# Patient Record
Sex: Female | Born: 1980 | Race: Black or African American | Hispanic: No | Marital: Single | Smoking: Former smoker
Health system: Southern US, Community
[De-identification: ages and names within clinical notes are randomized; demographics above are authoritative.]

## PROBLEM LIST (undated history)

## (undated) DIAGNOSIS — O1205 Gestational edema, complicating the puerperium: Secondary | ICD-10-CM

## (undated) DIAGNOSIS — J208 Acute bronchitis due to other specified organisms: Secondary | ICD-10-CM

## (undated) DIAGNOSIS — F419 Anxiety disorder, unspecified: Secondary | ICD-10-CM

## (undated) DIAGNOSIS — N920 Excessive and frequent menstruation with regular cycle: Secondary | ICD-10-CM

## (undated) DIAGNOSIS — R079 Chest pain, unspecified: Secondary | ICD-10-CM

## (undated) DIAGNOSIS — IMO0002 Reserved for concepts with insufficient information to code with codable children: Secondary | ICD-10-CM

## (undated) DIAGNOSIS — B351 Tinea unguium: Secondary | ICD-10-CM

## (undated) DIAGNOSIS — D259 Leiomyoma of uterus, unspecified: Secondary | ICD-10-CM

## (undated) DIAGNOSIS — E559 Vitamin D deficiency, unspecified: Secondary | ICD-10-CM

## (undated) DIAGNOSIS — B9689 Other specified bacterial agents as the cause of diseases classified elsewhere: Secondary | ICD-10-CM

## (undated) DIAGNOSIS — K219 Gastro-esophageal reflux disease without esophagitis: Secondary | ICD-10-CM

## (undated) DIAGNOSIS — E669 Obesity, unspecified: Secondary | ICD-10-CM

## (undated) DIAGNOSIS — M940 Chondrocostal junction syndrome [Tietze]: Secondary | ICD-10-CM

## (undated) HISTORY — DX: Reserved for concepts with insufficient information to code with codable children: IMO0002

## (undated) HISTORY — DX: Chondrocostal junction syndrome (tietze): M94.0

## (undated) HISTORY — DX: Vitamin D deficiency, unspecified: E55.9

## (undated) HISTORY — DX: Acute bronchitis due to other specified organisms: J20.8

## (undated) HISTORY — DX: Chest pain, unspecified: R07.9

## (undated) HISTORY — DX: Gastro-esophageal reflux disease without esophagitis: K21.9

## (undated) HISTORY — DX: Obesity, unspecified: E66.9

## (undated) HISTORY — DX: Excessive and frequent menstruation with regular cycle: N92.0

## (undated) HISTORY — PX: THYROIDECTOMY, PARTIAL: SHX18

## (undated) HISTORY — DX: Tinea unguium: B35.1

## (undated) HISTORY — DX: Other specified bacterial agents as the cause of diseases classified elsewhere: B96.89

## (undated) HISTORY — DX: Leiomyoma of uterus, unspecified: D25.9

## (undated) HISTORY — PX: WISDOM TOOTH EXTRACTION: SHX21

---

## 2003-02-16 ENCOUNTER — Encounter: Payer: Self-pay | Admitting: Family Medicine

## 2003-02-16 ENCOUNTER — Ambulatory Visit (HOSPITAL_COMMUNITY): Admission: RE | Admit: 2003-02-16 | Discharge: 2003-02-16 | Payer: Self-pay | Admitting: Family Medicine

## 2003-03-11 ENCOUNTER — Ambulatory Visit (HOSPITAL_COMMUNITY): Admission: RE | Admit: 2003-03-11 | Discharge: 2003-03-11 | Payer: Self-pay | Admitting: Family Medicine

## 2003-03-11 ENCOUNTER — Encounter: Payer: Self-pay | Admitting: Family Medicine

## 2003-05-30 ENCOUNTER — Emergency Department (HOSPITAL_COMMUNITY): Admission: EM | Admit: 2003-05-30 | Discharge: 2003-05-31 | Payer: Self-pay | Admitting: Emergency Medicine

## 2003-06-02 ENCOUNTER — Emergency Department (HOSPITAL_COMMUNITY): Admission: EM | Admit: 2003-06-02 | Discharge: 2003-06-02 | Payer: Self-pay | Admitting: Emergency Medicine

## 2004-03-15 ENCOUNTER — Ambulatory Visit (HOSPITAL_COMMUNITY): Admission: RE | Admit: 2004-03-15 | Discharge: 2004-03-15 | Payer: Self-pay | Admitting: Family Medicine

## 2004-04-10 ENCOUNTER — Ambulatory Visit (HOSPITAL_COMMUNITY): Admission: RE | Admit: 2004-04-10 | Discharge: 2004-04-10 | Payer: Self-pay | Admitting: Family Medicine

## 2004-10-02 ENCOUNTER — Ambulatory Visit: Payer: Self-pay | Admitting: Family Medicine

## 2004-10-05 ENCOUNTER — Emergency Department (HOSPITAL_COMMUNITY): Admission: EM | Admit: 2004-10-05 | Discharge: 2004-10-05 | Payer: Self-pay | Admitting: Emergency Medicine

## 2004-10-15 ENCOUNTER — Ambulatory Visit: Payer: Self-pay | Admitting: *Deleted

## 2004-11-14 ENCOUNTER — Ambulatory Visit: Payer: Self-pay | Admitting: Family Medicine

## 2004-11-28 ENCOUNTER — Ambulatory Visit: Payer: Self-pay | Admitting: *Deleted

## 2004-11-28 ENCOUNTER — Ambulatory Visit (HOSPITAL_COMMUNITY): Admission: RE | Admit: 2004-11-28 | Discharge: 2004-11-28 | Payer: Self-pay | Admitting: *Deleted

## 2004-11-29 ENCOUNTER — Ambulatory Visit: Payer: Self-pay | Admitting: *Deleted

## 2004-12-04 ENCOUNTER — Ambulatory Visit: Payer: Self-pay | Admitting: Family Medicine

## 2004-12-13 ENCOUNTER — Ambulatory Visit: Payer: Self-pay | Admitting: *Deleted

## 2005-03-12 ENCOUNTER — Ambulatory Visit: Payer: Self-pay | Admitting: Family Medicine

## 2005-08-15 ENCOUNTER — Ambulatory Visit: Payer: Self-pay | Admitting: Family Medicine

## 2005-08-17 ENCOUNTER — Ambulatory Visit (HOSPITAL_COMMUNITY): Admission: RE | Admit: 2005-08-17 | Discharge: 2005-08-17 | Payer: Self-pay | Admitting: Family Medicine

## 2005-08-20 ENCOUNTER — Ambulatory Visit (HOSPITAL_COMMUNITY): Admission: RE | Admit: 2005-08-20 | Discharge: 2005-08-20 | Payer: Self-pay | Admitting: Family Medicine

## 2005-10-26 ENCOUNTER — Emergency Department (HOSPITAL_COMMUNITY): Admission: EM | Admit: 2005-10-26 | Discharge: 2005-10-26 | Payer: Self-pay | Admitting: Family Medicine

## 2006-01-21 ENCOUNTER — Ambulatory Visit: Payer: Self-pay | Admitting: Family Medicine

## 2006-02-01 ENCOUNTER — Emergency Department (HOSPITAL_COMMUNITY): Admission: EM | Admit: 2006-02-01 | Discharge: 2006-02-01 | Payer: Self-pay | Admitting: Emergency Medicine

## 2006-03-31 ENCOUNTER — Encounter: Payer: Self-pay | Admitting: Family Medicine

## 2006-03-31 ENCOUNTER — Other Ambulatory Visit: Admission: RE | Admit: 2006-03-31 | Discharge: 2006-03-31 | Payer: Self-pay | Admitting: Family Medicine

## 2006-03-31 ENCOUNTER — Ambulatory Visit: Payer: Self-pay | Admitting: Family Medicine

## 2006-05-18 ENCOUNTER — Emergency Department (HOSPITAL_COMMUNITY): Admission: EM | Admit: 2006-05-18 | Discharge: 2006-05-18 | Payer: Self-pay | Admitting: Emergency Medicine

## 2006-09-25 ENCOUNTER — Ambulatory Visit: Payer: Self-pay | Admitting: Family Medicine

## 2006-09-26 ENCOUNTER — Ambulatory Visit (HOSPITAL_COMMUNITY): Admission: RE | Admit: 2006-09-26 | Discharge: 2006-09-26 | Payer: Self-pay | Admitting: Family Medicine

## 2007-04-01 ENCOUNTER — Emergency Department: Payer: Self-pay | Admitting: Emergency Medicine

## 2007-04-01 ENCOUNTER — Other Ambulatory Visit: Payer: Self-pay

## 2007-04-22 ENCOUNTER — Ambulatory Visit (HOSPITAL_COMMUNITY): Admission: RE | Admit: 2007-04-22 | Discharge: 2007-04-22 | Payer: Self-pay | Admitting: Obstetrics and Gynecology

## 2007-04-23 ENCOUNTER — Ambulatory Visit: Payer: Self-pay | Admitting: Family Medicine

## 2007-06-09 ENCOUNTER — Encounter: Admission: RE | Admit: 2007-06-09 | Discharge: 2007-06-09 | Payer: Self-pay | Admitting: Obstetrics and Gynecology

## 2007-06-13 ENCOUNTER — Inpatient Hospital Stay (HOSPITAL_COMMUNITY): Admission: AD | Admit: 2007-06-13 | Discharge: 2007-06-13 | Payer: Self-pay | Admitting: Obstetrics and Gynecology

## 2007-09-14 ENCOUNTER — Inpatient Hospital Stay (HOSPITAL_COMMUNITY): Admission: AD | Admit: 2007-09-14 | Discharge: 2007-09-18 | Payer: Self-pay | Admitting: Obstetrics and Gynecology

## 2007-11-07 ENCOUNTER — Encounter: Payer: Self-pay | Admitting: Family Medicine

## 2008-01-20 ENCOUNTER — Ambulatory Visit: Payer: Self-pay | Admitting: Family Medicine

## 2008-01-20 LAB — CONVERTED CEMR LAB: TSH: 1.699 microintl units/mL (ref 0.350–5.50)

## 2008-01-27 ENCOUNTER — Ambulatory Visit: Payer: Self-pay | Admitting: Family Medicine

## 2008-07-13 ENCOUNTER — Ambulatory Visit: Payer: Self-pay | Admitting: Family Medicine

## 2008-07-13 DIAGNOSIS — M25559 Pain in unspecified hip: Secondary | ICD-10-CM | POA: Insufficient documentation

## 2008-07-17 DIAGNOSIS — B351 Tinea unguium: Secondary | ICD-10-CM | POA: Insufficient documentation

## 2008-11-09 ENCOUNTER — Ambulatory Visit: Payer: Self-pay | Admitting: Family Medicine

## 2008-11-09 ENCOUNTER — Ambulatory Visit (HOSPITAL_COMMUNITY): Admission: RE | Admit: 2008-11-09 | Discharge: 2008-11-09 | Payer: Self-pay | Admitting: Family Medicine

## 2008-11-09 DIAGNOSIS — F3289 Other specified depressive episodes: Secondary | ICD-10-CM | POA: Insufficient documentation

## 2008-11-09 DIAGNOSIS — F329 Major depressive disorder, single episode, unspecified: Secondary | ICD-10-CM | POA: Insufficient documentation

## 2008-11-09 LAB — CONVERTED CEMR LAB
Basophils Relative: 0 % (ref 0–1)
Eosinophils Absolute: 0.2 10*3/uL (ref 0.0–0.7)
Eosinophils Relative: 2 % (ref 0–5)
MCV: 88.2 fL (ref 78.0–100.0)
Monocytes Relative: 9 % (ref 3–12)
Neutrophils Relative %: 80 % — ABNORMAL HIGH (ref 43–77)
RBC: 4.51 M/uL (ref 3.87–5.11)
RDW: 14.1 % (ref 11.5–15.5)
WBC: 9.9 10*3/uL (ref 4.0–10.5)

## 2008-11-10 ENCOUNTER — Telehealth: Payer: Self-pay | Admitting: Family Medicine

## 2008-12-07 ENCOUNTER — Telehealth: Payer: Self-pay | Admitting: Family Medicine

## 2008-12-08 ENCOUNTER — Encounter: Payer: Self-pay | Admitting: Family Medicine

## 2008-12-08 ENCOUNTER — Encounter (HOSPITAL_COMMUNITY): Admission: RE | Admit: 2008-12-08 | Discharge: 2009-01-07 | Payer: Self-pay | Admitting: Family Medicine

## 2008-12-13 ENCOUNTER — Ambulatory Visit: Payer: Self-pay | Admitting: Family Medicine

## 2008-12-13 ENCOUNTER — Telehealth: Payer: Self-pay | Admitting: Family Medicine

## 2008-12-13 LAB — CONVERTED CEMR LAB
Bilirubin Urine: NEGATIVE
Glucose, Urine, Semiquant: NEGATIVE
Specific Gravity, Urine: 1.015

## 2008-12-14 ENCOUNTER — Encounter: Payer: Self-pay | Admitting: Family Medicine

## 2008-12-19 ENCOUNTER — Encounter: Payer: Self-pay | Admitting: Family Medicine

## 2009-02-03 ENCOUNTER — Encounter: Payer: Self-pay | Admitting: Family Medicine

## 2009-04-28 ENCOUNTER — Emergency Department (HOSPITAL_COMMUNITY): Admission: EM | Admit: 2009-04-28 | Discharge: 2009-04-28 | Payer: Self-pay | Admitting: Family Medicine

## 2009-04-28 ENCOUNTER — Telehealth: Payer: Self-pay | Admitting: Family Medicine

## 2009-05-01 ENCOUNTER — Telehealth: Payer: Self-pay | Admitting: Family Medicine

## 2009-05-03 ENCOUNTER — Ambulatory Visit: Payer: Self-pay | Admitting: Family Medicine

## 2009-05-05 ENCOUNTER — Encounter: Payer: Self-pay | Admitting: Family Medicine

## 2009-06-13 ENCOUNTER — Ambulatory Visit: Payer: Self-pay | Admitting: Family Medicine

## 2009-06-14 ENCOUNTER — Encounter: Payer: Self-pay | Admitting: Family Medicine

## 2009-06-14 LAB — CONVERTED CEMR LAB
Candida species: NEGATIVE
Chlamydia, DNA Probe: NEGATIVE

## 2009-06-15 ENCOUNTER — Encounter: Payer: Self-pay | Admitting: Family Medicine

## 2009-06-15 LAB — CONVERTED CEMR LAB
BUN: 11 mg/dL (ref 6–23)
Basophils Absolute: 0 10*3/uL (ref 0.0–0.1)
Chloride: 109 meq/L (ref 96–112)
Cholesterol: 141 mg/dL (ref 0–200)
Creatinine, Ser: 0.84 mg/dL (ref 0.40–1.20)
HCT: 40.5 % (ref 36.0–46.0)
Hemoglobin: 13.5 g/dL (ref 12.0–15.0)
LDL Cholesterol: 87 mg/dL (ref 0–99)
Lymphs Abs: 1.6 10*3/uL (ref 0.7–4.0)
MCV: 86.5 fL (ref 78.0–100.0)
Monocytes Absolute: 0.6 10*3/uL (ref 0.1–1.0)
Monocytes Relative: 12 % (ref 3–12)
Neutro Abs: 2.8 10*3/uL (ref 1.7–7.7)
Neutrophils Relative %: 55 % (ref 43–77)
Potassium: 4.6 meq/L (ref 3.5–5.3)
RBC: 4.68 M/uL (ref 3.87–5.11)
Triglycerides: 71 mg/dL (ref <150)
VLDL: 14 mg/dL (ref 0–40)

## 2009-06-25 DIAGNOSIS — F17200 Nicotine dependence, unspecified, uncomplicated: Secondary | ICD-10-CM | POA: Insufficient documentation

## 2009-07-12 ENCOUNTER — Telehealth: Payer: Self-pay | Admitting: Family Medicine

## 2009-09-08 ENCOUNTER — Ambulatory Visit: Payer: Self-pay | Admitting: Family Medicine

## 2009-09-09 DIAGNOSIS — M25569 Pain in unspecified knee: Secondary | ICD-10-CM | POA: Insufficient documentation

## 2009-11-21 ENCOUNTER — Telehealth: Payer: Self-pay | Admitting: Family Medicine

## 2009-11-22 ENCOUNTER — Ambulatory Visit: Payer: Self-pay | Admitting: Family Medicine

## 2009-11-22 DIAGNOSIS — E049 Nontoxic goiter, unspecified: Secondary | ICD-10-CM | POA: Insufficient documentation

## 2009-11-22 DIAGNOSIS — R51 Headache: Secondary | ICD-10-CM | POA: Insufficient documentation

## 2009-11-22 DIAGNOSIS — R519 Headache, unspecified: Secondary | ICD-10-CM | POA: Insufficient documentation

## 2009-11-30 ENCOUNTER — Ambulatory Visit (HOSPITAL_COMMUNITY): Admission: RE | Admit: 2009-11-30 | Discharge: 2009-11-30 | Payer: Self-pay | Admitting: Unknown Physician Specialty

## 2009-11-30 ENCOUNTER — Telehealth: Payer: Self-pay | Admitting: Family Medicine

## 2009-12-05 ENCOUNTER — Emergency Department (HOSPITAL_COMMUNITY): Admission: EM | Admit: 2009-12-05 | Discharge: 2009-12-06 | Payer: Self-pay | Admitting: Emergency Medicine

## 2009-12-06 ENCOUNTER — Telehealth: Payer: Self-pay | Admitting: Family Medicine

## 2009-12-07 ENCOUNTER — Encounter: Payer: Self-pay | Admitting: Family Medicine

## 2010-01-02 ENCOUNTER — Telehealth (INDEPENDENT_AMBULATORY_CARE_PROVIDER_SITE_OTHER): Payer: Self-pay | Admitting: *Deleted

## 2010-01-04 ENCOUNTER — Encounter (INDEPENDENT_AMBULATORY_CARE_PROVIDER_SITE_OTHER): Payer: Self-pay | Admitting: Otolaryngology

## 2010-01-04 ENCOUNTER — Ambulatory Visit (HOSPITAL_COMMUNITY): Admission: RE | Admit: 2010-01-04 | Discharge: 2010-01-05 | Payer: Self-pay | Admitting: Otolaryngology

## 2010-01-12 ENCOUNTER — Encounter: Payer: Self-pay | Admitting: Family Medicine

## 2010-01-16 ENCOUNTER — Ambulatory Visit: Payer: Self-pay | Admitting: Family Medicine

## 2010-01-16 DIAGNOSIS — B839 Helminthiasis, unspecified: Secondary | ICD-10-CM | POA: Insufficient documentation

## 2010-01-17 ENCOUNTER — Telehealth: Payer: Self-pay | Admitting: Family Medicine

## 2010-01-18 ENCOUNTER — Telehealth: Payer: Self-pay | Admitting: Family Medicine

## 2010-03-05 ENCOUNTER — Other Ambulatory Visit: Admission: RE | Admit: 2010-03-05 | Discharge: 2010-03-05 | Payer: Self-pay | Admitting: Family Medicine

## 2010-03-05 ENCOUNTER — Ambulatory Visit: Payer: Self-pay | Admitting: Family Medicine

## 2010-03-05 ENCOUNTER — Encounter (INDEPENDENT_AMBULATORY_CARE_PROVIDER_SITE_OTHER): Payer: Self-pay

## 2010-03-05 DIAGNOSIS — R110 Nausea: Secondary | ICD-10-CM | POA: Insufficient documentation

## 2010-03-05 DIAGNOSIS — N760 Acute vaginitis: Secondary | ICD-10-CM | POA: Insufficient documentation

## 2010-03-05 DIAGNOSIS — R5381 Other malaise: Secondary | ICD-10-CM | POA: Insufficient documentation

## 2010-03-05 DIAGNOSIS — R5383 Other fatigue: Secondary | ICD-10-CM

## 2010-03-05 DIAGNOSIS — K5289 Other specified noninfective gastroenteritis and colitis: Secondary | ICD-10-CM | POA: Insufficient documentation

## 2010-03-05 LAB — CONVERTED CEMR LAB: Pap Smear: NEGATIVE

## 2010-03-06 ENCOUNTER — Encounter: Payer: Self-pay | Admitting: Family Medicine

## 2010-03-06 LAB — CONVERTED CEMR LAB: Chlamydia, DNA Probe: NEGATIVE

## 2010-03-07 ENCOUNTER — Telehealth: Payer: Self-pay | Admitting: Family Medicine

## 2010-03-08 ENCOUNTER — Encounter: Payer: Self-pay | Admitting: Family Medicine

## 2010-03-12 ENCOUNTER — Telehealth: Payer: Self-pay | Admitting: Family Medicine

## 2010-03-15 ENCOUNTER — Encounter: Payer: Self-pay | Admitting: Family Medicine

## 2010-03-15 ENCOUNTER — Ambulatory Visit (HOSPITAL_COMMUNITY): Admission: RE | Admit: 2010-03-15 | Discharge: 2010-03-15 | Payer: Self-pay | Admitting: Family Medicine

## 2010-04-06 ENCOUNTER — Telehealth: Payer: Self-pay | Admitting: Family Medicine

## 2010-04-09 ENCOUNTER — Ambulatory Visit: Payer: Self-pay | Admitting: Family Medicine

## 2010-04-10 ENCOUNTER — Encounter: Payer: Self-pay | Admitting: Physician Assistant

## 2010-04-10 LAB — CONVERTED CEMR LAB
Chlamydia, DNA Probe: NEGATIVE
GC Probe Amp, Genital: NEGATIVE
Gardnerella vaginalis: POSITIVE — AB

## 2010-04-16 ENCOUNTER — Telehealth: Payer: Self-pay | Admitting: Family Medicine

## 2010-04-19 ENCOUNTER — Telehealth: Payer: Self-pay | Admitting: Family Medicine

## 2010-06-24 ENCOUNTER — Emergency Department (HOSPITAL_COMMUNITY): Admission: EM | Admit: 2010-06-24 | Discharge: 2010-06-24 | Payer: Self-pay | Admitting: Emergency Medicine

## 2010-06-26 ENCOUNTER — Ambulatory Visit: Payer: Self-pay | Admitting: Family Medicine

## 2010-06-26 DIAGNOSIS — M549 Dorsalgia, unspecified: Secondary | ICD-10-CM | POA: Insufficient documentation

## 2010-07-02 ENCOUNTER — Encounter: Payer: Self-pay | Admitting: Family Medicine

## 2010-07-02 ENCOUNTER — Telehealth: Payer: Self-pay | Admitting: Family Medicine

## 2010-07-10 ENCOUNTER — Telehealth: Payer: Self-pay | Admitting: Family Medicine

## 2010-09-03 ENCOUNTER — Ambulatory Visit (HOSPITAL_COMMUNITY): Admission: RE | Admit: 2010-09-03 | Discharge: 2010-09-03 | Payer: Self-pay | Admitting: Sports Medicine

## 2010-10-15 ENCOUNTER — Encounter: Payer: Self-pay | Admitting: Family Medicine

## 2010-12-15 ENCOUNTER — Encounter: Payer: Self-pay | Admitting: Family Medicine

## 2010-12-16 ENCOUNTER — Encounter: Payer: Self-pay | Admitting: Family Medicine

## 2010-12-25 NOTE — Progress Notes (Signed)
Summary: RX  Phone Note Call from Patient   Summary of Call: CALL HER ABOUT A RX THAT WAS SUPPOSED TO HAVE BEEN SENT IN APRIL  CALL BACK AT [phone] Initial call taken by: Lind Guest,  Apr 16, 2010 9:59 AM  Follow-up for Phone Call        Patient aware. RX resent  Follow-up by: Everitt Amber LPN,  Apr 16, 2010 10:12 AM    Prescriptions: PHENTERMINE HCL 37.5 MG TABS (PHENTERMINE HCL) Take 1 tablet by mouth once a day  #30 x 2   Entered by:   Everitt Amber LPN   Authorized by:   Syliva Overman MD   Signed by:   Everitt Amber LPN on 08/65/7846   Method used:   Printed then faxed to ...       [facility]* (retail)       [address]       [place], [place]  [address]       Ph: [phone]       Fax: [phone]   RxID:   [id number]

## 2010-12-25 NOTE — Progress Notes (Signed)
Summary: speak with nurse  Phone Note Call from Patient   Summary of Call: pt would like for nurse to call her back at [phone] Initial call taken by: Rudene Anda,  March 12, 2010 10:34 AM  Follow-up for Phone Call        returned call, left message Follow-up by: Adella Hare LPN,  March 12, 2010 1:03 PM  Additional Follow-up for Phone Call Additional follow up Details #1::        patient wanted to know if she could take stool sample to any lab, advised any [facility] lab Additional Follow-up by: Adella Hare LPN,  March 13, 2010 8:21 AM

## 2010-12-25 NOTE — Progress Notes (Signed)
Summary: CALL BACK  Phone Note Call from Patient   Summary of Call: WANTS NURSE TO CALL HER AT [phone] pt needs rx for headaches sent to out pt pharm at [facility]  Initial call taken by: Lind Guest,  December 06, 2009 10:50 AM  Follow-up for Phone Call        Rx Called In, returned call,  no answer Follow-up by: Worthy Keeler LPN,  December 06, 2009 2:34 PM    Prescriptions: Marikay Alar 50-325-40 MG TABS [facility]) Take 1 tab by mouth at bedtime as needed  #10 x 1   Entered by:   Worthy Keeler LPN   Authorized by:   Syliva Overman MD   Signed by:   Worthy Keeler LPN on 30/86/5784   Method used:   Electronically to        [facility]* (retail)       [address]       [address] Shipping/mailing       [place], [place]  [address]       Ph: [phone]       Fax: [phone]   RxID:   [id number]

## 2010-12-25 NOTE — Letter (Signed)
Summary: Out of Work  [facility]  [address]   [place], [place] [address]   Phone: [phone]  Fax: [phone]    March 05, 2010   Employee:  [name]    To Whom It May Concern:   For Medical reasons, please excuse the above named employee from work for the following dates:  Start:   03/05/2010  End:   03/07/2010 Without restrictions   If you need additional information, please feel free to contact our office.         Sincerely,    Milus Mallick. Lodema Hong, M.D.

## 2010-12-25 NOTE — Progress Notes (Signed)
Summary: speak with nurse  Phone Note Call from Patient   Summary of Call: pt needs to speak with nuse asap.   [phone] Initial call taken by: Rudene Anda,  July 10, 2010 9:14 AM  Follow-up for Phone Call        patient has some questions about disability papers  advised her to consult with her HR dept at work and if any forms to be filled out will need to direct to her orthopedist who is treating her injuries Follow-up by: Adella Hare LPN,  July 10, 2010 9:19 AM

## 2010-12-25 NOTE — Letter (Signed)
Summary: [place]  [place]d [place]   Imported By: Rudene Anda 03/08/2010 13:32:55  _____________________________________________________________________  External Attachment:    Type:   Image     Comment:   External Document

## 2010-12-25 NOTE — Progress Notes (Signed)
Summary: DR. Newman Pies  DR. SU TEOH   Imported By: Lind Guest 12/11/2009 09:58:15  _____________________________________________________________________  External Attachment:    Type:   Image     Comment:   External Document

## 2010-12-25 NOTE — Assessment & Plan Note (Signed)
Summary: discuss results- room 3   Vital Signs:  Patient profile:   30 year old female Menstrual status:  regular LMP:     03/26/2010 Height:      66 inches Weight:      204 pounds BMI:     33.05 O2 Sat:      98 % on Room air Pulse rate:   91 / minute Resp:     16 per minute BP sitting:   112 / 62  (left arm)  Vitals Entered By: Adella Hare LPN (Apr 09, 2010 10:26 AM) CC: discuss test results Is Patient Diabetic? No Pain Assessment Patient in pain? no      Comments did not bring meds to ov LMP (date): 03/26/2010     Enter LMP: 03/26/2010 Last PAP Result NEGATIVE FOR INTRAEPITHELIAL LESIONS OR MALIGNANCY.   CC:  discuss test results.  History of Present Illness: Pt presents requesting STD screening.  She is aware that she had neg cxs last mos, but has learned that her partner has not been monogamous & would like her cxs repeated and lab work for [employer].  She has had chronic vaginal discharge.  Has worsened recently but she feels this is due to a new scented bath soap she is using & plans to switch back to her prev soap.  She has prescription for [employer].  Has not used it in the last 2 mos.  Planned to be abstinant, but didn't. LMP was about 2 weeks ago and was normal.   Allergies (verified): No Known Drug Allergies  Past History:  Past medical history reviewed for relevance to current acute and chronic problems.  Past Medical History: Reviewed history from 01/16/2010 and no changes required. Obesity Back Pain radiating to legs since 2006 tapeworm infestation 12/2009  Review of Systems General:  Denies chills and fever. GI:  Denies abdominal pain and change in bowel habits. GU:  Complains of discharge; denies abnormal vaginal bleeding, dysuria, genital sores, and urinary frequency.  Physical Exam  General:  Well-developed,well-nourished,in no acute distress; alert,appropriate and cooperative throughout examination Head:  Normocephalic and atraumatic  without obvious abnormalities. No apparent alopecia or balding. Genitalia:  Normal introitus for age, no external lesions, small amount of white vaginal mucus noted, mucosa pink and moist, no vaginal or cervical lesions, no vaginal atrophy, no friaility or hemorrhage, normal uterus size and position, no adnexal masses or tenderness Psych:  Cognition and judgment appear intact. Alert and cooperative with normal attention span and concentration. No apparent delusions, illusions, hallucinations   Impression & Recommendations:  Problem # 1:  VAGINITIS (ICD-616.10) Assessment Unchanged Discussed with pt to use condoms if she decides to continue to be sexually active.   Her updated medication list for this problem includes:    Penicillin V Potassium 500 Mg Tabs (Penicillin v potassium) .Marland Kitchen... Take 1 tablet by mouth three times a day  Orders: T-HIV Antibody  (Reflex) [phone]) T-RPR [facility]) [phone]) Herpes Simplex Type 2 [phone]) T-Wet Prep by Molecular Probe [phone]) Chlamydia and GC Probe Amp, genital ([id number]) T-Chlamydia & GC Probe, Genital ([id number])  Problem # 2:  SCREENING EXAMINATION FOR VENEREAL DISEASE (ICD-V74.5) Assessment: Comment Only Encouraged pt to consider her current relationship and that she deserves a partner who will be faithful and monogamous.  Orders: T-HIV Antibody  (Reflex) [id number]) T-RPR [facility]) [phone]) Herpes Simplex Type 2 ([phone])  Complete Medication List: 1)  Fioricet 50-325-40 Mg Tabs (Butalbital-apap-caffeine) .... Take 1 tab by mouth at bedtime as  needed 2)  Vicodin 5-500 Mg Tabs (Hydrocodone-acetaminophen) .Marland Kitchen.. 1-2 tabs q 4-6 hrs as needed 3)  Multivitamins Tabs (Multiple vitamin) .... Take 1 tablet by mouth once a day 4)  Phentermine Hcl 37.5 Mg Tabs (Phentermine hcl) .... Take 1 tablet by mouth once a day 5)  Penicillin V Potassium 500 Mg Tabs (Penicillin v potassium) .... Take 1 tablet by mouth three  times a day 6)  Fluconazole 150 Mg Tabs (Fluconazole) .... Take 1 tablet by mouth once a day as needed 7)  Promethazine Hcl 25 Mg Tabs (Promethazine hcl) .... Take 1 tablet by mouth three times a day as needed 8)  Dicyclomine Hcl 10 Mg Caps (Dicyclomine hcl) .... Take 1 capsule by mouth three times a day as needed  Patient Instructions: 1)  Please schedule a follow-up appointment as needed. 2)  I will notify you of your test results when they are available. 3)  Use condoms if you do not abstain from sex.

## 2010-12-25 NOTE — Progress Notes (Signed)
Summary: test  Phone Note Call from Patient   Summary of Call: wants to add ct of sinus for today. if not this can we call her in something for sinus. [phone] Initial call taken by: Rudene Anda,  November 30, 2009 9:30 AM  Follow-up for Phone Call        no fever, no chills, some body aches, thick brown nasal drainage for several weeks Follow-up by: Worthy Keeler LPN,  November 30, 2009 10:22 AM  Additional Follow-up for Phone Call Additional follow up Details #1::        advise her the mri brain will show her sinuses Additional Follow-up by: Syliva Overman MD,  November 30, 2009 11:20 AM    Additional Follow-up for Phone Call Additional follow up Details #2::    patient aware Follow-up by: Worthy Keeler LPN,  November 30, 2009 2:15 PM

## 2010-12-25 NOTE — Progress Notes (Signed)
Summary: DR.TEOH  DR.TEOH   Imported By: Lind Guest 01/18/2010 10:51:29  _____________________________________________________________________  External Attachment:    Type:   Image     Comment:   External Document

## 2010-12-25 NOTE — Progress Notes (Signed)
Summary: meds  Phone Note Call from Patient   Summary of Call: call pt back please about her meds.  [phone] Initial call taken by: Rudene Anda,  January 17, 2010 2:00 PM  Follow-up for Phone Call        advise med sent in , sorry about the delay, hand washing is key, retest stool one month after treatment. i recommendher mother and brother who are our pts get tested as well, if they agreepls give them containers for stool for o/p Follow-up by: Syliva Overman MD,  January 18, 2010 12:36 PM  Additional Follow-up for Phone Call Additional follow up Details #1::        Patient aware. Told her if mom and brother wants to be tested to call office and i would provide them with containers Additional Follow-up by: Everitt Amber LPN,  January 18, 2010 12:59 PM

## 2010-12-25 NOTE — Assessment & Plan Note (Signed)
Summary: OV   Vital Signs:  Patient profile:   30 year old female Menstrual status:  regular Height:      66 inches (167.64 cm) Weight:      209 pounds (95.00 kg) BMI:     33.86 O2 Sat:      98 % Pulse rate:   104 / minute Pulse rhythm:   regular Resp:     16 per minute BP sitting:   120 / 66 Cuff size:   large  Vitals Entered By: Everitt Amber LPN (January 16, 2010 1:47 PM)  Nutrition Counseling: Patient's BMI is greater than 25 and therefore counseled on weight management options. CC: needs pap smear scheduled and wants phentermine again. She tested positve for tapeworms and needs treatment   CC:  needs pap smear scheduled and wants phentermine again. She tested positve for tapeworms and needs treatment.  History of Present Illness: pt in with a new dx of tapeworm instation detected by her vet, she is requesting med, she does eport an episode of rectal blood which is new, i am unable to assure her that this isnecessarily due to the worm. sherecently had hemithyroidectomy, still hasalot of hair loss and is concerned. shealso wants to resume phentermine to asist with weight loss. She otherwise has no concerns  Preventive Screening-Counseling & Management  Alcohol-Tobacco     Smoking Status: quit  Current Medications (verified): 1)  Fioricet 50-325-40 Mg Tabs (Butalbital-Apap-Caffeine) .... Take 1 Tab By Mouth At Bedtime As Needed 2)  Vicodin 5-500 Mg Tabs (Hydrocodone-Acetaminophen) .Marland Kitchen.. 1-2 Tabs Q 4-6 Hrs As Needed 3)  Multivitamins  Tabs (Multiple Vitamin) .... Take 1 Tablet By Mouth Once A Day  Allergies (verified): No Known Drug Allergies  Past History:  Past medical, surgical, family and social histories (including risk factors) reviewed, and no changes noted (except as noted below).  Past Medical History: Obesity Back Pain radiating to legs since 2006 tapeworm infestation 12/2009  Past Surgical History: right hemithyroidectomy, benign disease  12/04/2009  Family History: Reviewed history from 11/07/2007 and no changes required. Mother living - HTN, depression Father - unknown One brother - healthy  Social History: Reviewed history from 06/13/2009 and no changes required. Employed Single One child Alcohol use-no Drug use-no Former Smoker  quit 11/2009 Smoking Status:  quit  Review of Systems      See HPI General:  Denies chills and fever. Eyes:  Denies blurring and discharge. ENT:  Denies hoarseness, nasal congestion, sinus pressure, and sore throat. CV:  Denies chest pain or discomfort and palpitations. Resp:  Denies cough and sputum productive. GI:  See HPI; Denies diarrhea, indigestion, nausea, and vomiting. GU:  Denies dysuria and urinary frequency. MS:  Complains of low back pain and mid back pain; denies joint pain and stiffness. Derm:  Denies itching, lesion(s), and rash. Neuro:  Denies headaches and seizures. Psych:  Denies anxiety and depression. Endo:  Denies cold intolerance, excessive hunger, excessive thirst, excessive urination, heat intolerance, polyuria, and weight change. Heme:  Denies abnormal bruising and bleeding. Allergy:  Denies hives or rash and sneezing.  Physical Exam  General:  Well-developed,well-nourished,in no acute distress; alert,appropriate and cooperative throughout examination HEENT: No facial asymmetry,  EOMI, No sinus tenderness, TM's Clear, oropharynx  pink and moist.   Chest: Clear to auscultation bilaterally.  CVS: S1, S2, No murmurs, No S3.   Abd: Soft, Nontender.  MS: Adequate ROM spine, hips, shoulders and knees.  Ext: No edema.   CNS: CN 2-12 intact, power  tone and sensation normal throughout.   Skin: Intact, no visible lesions or rashes.  Psych: Good eye contact, normal affect.  Memory intact, not anxious or depressed appearing.    Impression & Recommendations:  Problem # 1:  UNSPECIFIED HELMINTH INFECTION (ICD-128.9) Assessment Comment Only  Orders: T-Ova  and Parasites ([address]), one dse of antihelminthic prescribed, familty members need to be tested also  Problem # 2:  OBESITY (ICD-278.00) Assessment: Unchanged  Ht: 66 (01/16/2010)   Wt: 209 (01/16/2010)   BMI: 33.86 (01/16/2010)  Problem # 3:  HEADACHE (ICD-784.0) Assessment: Improved  The following medications were removed from the medication list:    Ibuprofen 800 Mg Tabs (Ibuprofen) .Marland Kitchen... Take two tabs two times a day Her updated medication list for this problem includes:    Fioricet 50-325-40 Mg Tabs (Butalbital-apap-caffeine) .Marland Kitchen... Take 1 tab by mouth at bedtime as needed    Vicodin 5-500 Mg Tabs (Hydrocodone-acetaminophen) .Marland Kitchen... 1-2 tabs q 4-6 hrs as needed  Complete Medication List: 1)  Fioricet 50-325-40 Mg Tabs (Butalbital-apap-caffeine) .... Take 1 tab by mouth at bedtime as needed 2)  Vicodin 5-500 Mg Tabs (Hydrocodone-acetaminophen) .Marland Kitchen.. 1-2 tabs q 4-6 hrs as needed 3)  Multivitamins Tabs (Multiple vitamin) .... Take 1 tablet by mouth once a day 4)  Phentermine Hcl 37.5 Mg Tabs (Phentermine hcl) .... Take 1 tablet by mouth once a day 5)  Biltricide 600 Mg Tabs (Praziquantel) .... One tablet by mouth  Patient Instructions: 1)  CPE in 7 weeks. 2)  It is important that you exercise regularly at least 20 minutes 5 times a week. If you develop chest pain, have severe difficulty breathing, or feel very tired , stop exercising immediately and seek medical attention. 3)  You need to lose weight. Consider a lower calorie diet and regular exercise.  4)  Meds are also sent in for worm infestation Prescriptions: BILTRICIDE 600 MG TABS (PRAZIQUANTEL) one tablet by mouth  #1 x 0   Entered and Authorized by:   Syliva Overman MD   Signed by:   Syliva Overman MD on 01/18/2010   Method used:   Electronically to        [facility]* (retail)       [address]       [address] Shipping/mailing       [place], [place]  [address]       Ph: [phone]       Fax:  [phone]   RxID:   [id number] PHENTERMINE HCL 37.5 MG TABS (PHENTERMINE HCL) Take 1 tablet by mouth once a day  #30 x 0   Entered and Authorized by:   Syliva Overman MD   Signed by:   Syliva Overman MD on 01/16/2010   Method used:   Printed then faxed to ...       [facility]* (retail)       [address]       [place], [place]  [address]       Ph: [phone]       Fax: [phone]   RxID:   [id number]

## 2010-12-25 NOTE — Progress Notes (Signed)
Summary: results  Phone Note Call from Patient   Summary of Call: pt would like to get results of pap and check up [phone] Initial call taken by: Rudene Anda,  Apr 06, 2010 3:45 PM  Follow-up for Phone Call        patient aware all test were negative Follow-up by: Adella Hare LPN,  Apr 06, 2010 4:26 PM

## 2010-12-25 NOTE — Letter (Signed)
Summary: [employer]   Imported By: Lind Guest 01/16/2010 16:03:21  _____________________________________________________________________  External Attachment:    Type:   Image     Comment:   External Document

## 2010-12-25 NOTE — Letter (Signed)
Summary: Out of Work  [facility]  [address]   [place], [place] [address]   Phone: [phone]  Fax: [phone]    July 02, 2010   Employee:  [name]    To Whom It May Concern:   For Medical reasons, please excuse the above named employee from work for the following dates:  Start:   06/26/09  End:   06/28/09  If you need additional information, please feel free to contact our office.         Sincerely,    Milus Mallick. Lodema Hong, MD
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
## 2010-12-25 NOTE — Assessment & Plan Note (Signed)
Summary: PHY   Vital Signs:  Patient profile:   30 year old female Menstrual status:  regular LMP:     02/24/2010 Height:      66 inches Weight:      205 pounds BMI:     33.21 O2 Sat:      99 % Pulse rate:   87 / minute Pulse rhythm:   regular Resp:     16 per minute BP sitting:   114 / 72  (left arm) Cuff size:   large  Vitals Entered By: Everitt Amber LPN (March 05, 2010 2:14 PM)  Nutrition Counseling: Patient's BMI is greater than 25 and therefore counseled on weight management options. CC: CPE LMP (date): 02/24/2010     Enter LMP: 02/24/2010 Last PAP Result Normal   CC:  CPE.  History of Present Illness: Reports  thatshe had been doing well up until 1 day ago , wen she developed acute gastroenteritis from her 77 year old daughter. Denies recent fever or chills. Denies sinus pressure, nasal congestion , ear pain or sore throat. Denies chest congestion, or cough productive of sputum. Denies chest pain, palpitations, PND, orthopnea or leg swelling.  Denies change in bowel movements or bloody stool. Denies dysuria , frequency, incontinence or hesitancy. Denies  joint pain, swelling, or reduced mobility. Denies headaches, vertigo, seizures. Denies depression, anxiety or insomnia. Denies  rash, lesions, or itch. She continues to take phentermine half daily but has not been as diligent wioith exercise in the past few weeks due to the illness of her mother putting extra stress on her.     Current Medications (verified): 1)  Fioricet 50-325-40 Mg Tabs (Butalbital-Apap-Caffeine) .... Take 1 Tab By Mouth At Bedtime As Needed 2)  Vicodin 5-500 Mg Tabs (Hydrocodone-Acetaminophen) .Marland Kitchen.. 1-2 Tabs Q 4-6 Hrs As Needed 3)  Multivitamins  Tabs (Multiple Vitamin) .... Take 1 Tablet By Mouth Once A Day 4)  Phentermine Hcl 37.5 Mg Tabs (Phentermine Hcl) .... Take 1 Tablet By Mouth Once A Day  Allergies (verified): No Known Drug Allergies  Review of Systems      See HPI General:   Complains of fatigue and weakness; denies chills and fever; 2 day history with acute illness. Eyes:  Denies blurring, double vision, and red eye. ENT:  Denies earache, hoarseness, nasal congestion, and sinus pressure. CV:  Denies chest pain or discomfort, palpitations, and swelling of feet. Resp:  Denies cough and sputum productive. GI:  Complains of abdominal pain, diarrhea, nausea, and vomiting; 2 day history, positive sick contact at home. GU:  Denies dysuria, incontinence, urinary frequency, and urinary hesitancy. MS:  Complains of joint pain and stiffness; bilateral knees, right greater than left.  Physical Exam  General:  Well-developed,well-nourished,in no acute distress; alert,appropriate and cooperative throughout examination Head:  Normocephalic and atraumatic without obvious abnormalities. No apparent alopecia or balding. Eyes:  No corneal or conjunctival inflammation noted. EOMI. Perrla. Funduscopic exam benign, without hemorrhages, exudates or papilledema. Vision grossly normal. Ears:  External ear exam shows no significant lesions or deformities.  Otoscopic examination reveals clear canals, tympanic membranes are intact bilaterally without bulging, retraction, inflammation or discharge. Hearing is grossly normal bilaterally. Nose:  External nasal examination shows no deformity or inflammation. Nasal mucosa are pink and moist without lesions or exudates. Mouth:  Oral mucosa and oropharynx without lesions or exudates.  Teeth in good repair. Neck:  No deformities, masses, or tenderness noted. Chest Wall:  No deformities, masses, or tenderness noted. Breasts:  No mass,  nodules, thickening, tenderness, bulging, retraction, inflamation, nipple discharge or skin changes noted.   Lungs:  Normal respiratory effort, chest expands symmetrically. Lungs are clear to auscultation, no crackles or wheezes. Heart:  Normal rate and regular rhythm. S1 and S2 normal without gallop, murmur, click, rub  or other extra sounds. Abdomen:  Bowel sounds hyperactivebowel sounds, diffuse superficial tenderness, no organomegaly or masses Rectal:  deferred Genitalia:  Normal introitus for age, no external lesions, no vaginal discharge, mucosa pink and moist, no vaginal or cervical lesions, no vaginal atrophy, no friaility or hemorrhage, normal uterus size and position, no adnexal masses or tenderness Msk:  No deformity or scoliosis noted of thoracic or lumbar spine.   Pulses:  R and L carotid,radial,femoral,dorsalis pedis and posterior tibial pulses are full and equal bilaterally Extremities:  right knee  swollen and tender with reduced ROM Neurologic:  alert & oriented X3, cranial nerves II-XII intact, strength normal in all extremities, and sensation intact to pinprick.   Skin:  Intact without suspicious lesions or rashes Cervical Nodes:  No lymphadenopathy noted Axillary Nodes:  No palpable lymphadenopathy Inguinal Nodes:  No significant adenopathy Psych:  Cognition and judgment appear intact. Alert and cooperative with normal attention span and concentration. No apparent delusions, illusions, hallucinations   Impression & Recommendations:  Problem # 1:  OBESITY (ICD-278.00) Assessment Improved  Ht: 66 (03/05/2010)   Wt: 205 (03/05/2010)   BMI: 33.21 (03/05/2010)  Problem # 2:  NAUSEA (ICD-787.02) Assessment: Comment Only  Orders: Zofran 1mg . injection (Z6109) Admin of Therapeutic Inj  intramuscular or subcutaneous (60454)  Problem # 3:  KNEE PAIN, BILATERAL (ICD-719.46) Assessment: Unchanged  Her updated medication list for this problem includes:    Fioricet 50-325-40 Mg Tabs (Butalbital-apap-caffeine) .Marland Kitchen... Take 1 tab by mouth at bedtime as needed    Vicodin 5-500 Mg Tabs (Hydrocodone-acetaminophen) .Marland Kitchen... 1-2 tabs q 4-6 hrs as needed  Problem # 4:  GASTROENTERITIS, ACUTE (ICD-558.9) Assessment: Comment Only  Discussed use of medication and role of diet. Encouraged clear liquids  and electrolyte replacement fluids. Instructed to call if any signs of worsening dehydration.   Complete Medication List: 1)  Fioricet 50-325-40 Mg Tabs (Butalbital-apap-caffeine) .... Take 1 tab by mouth at bedtime as needed 2)  Vicodin 5-500 Mg Tabs (Hydrocodone-acetaminophen) .Marland Kitchen.. 1-2 tabs q 4-6 hrs as needed 3)  Multivitamins Tabs (Multiple vitamin) .... Take 1 tablet by mouth once a day 4)  Phentermine Hcl 37.5 Mg Tabs (Phentermine hcl) .... Take 1 tablet by mouth once a day  Other Orders: T-Basic Metabolic Panel [id number]) T-CBC w/Diff [id number]) T-Lipid Profile [id number]) T-Vitamin D (25-Hydroxy) [phone]) T-Wet Prep by Molecular Probe [id number]) T-Chlamydia & GC Probe, Genital ([id number]) Pap Smear (03474)  Patient Instructions: 1)  Please schedule a follow-up appointment in 3 months. 2)  It is important that you exercise regularly at least 20 minutes 5 times a week. If you develop chest pain, have severe difficulty breathing, or feel very tired , stop exercising immediately and seek medical attention. 3)  You need to lose weight. Consider a lower calorie diet and regular exercise.  4)  yOU WILL RECEIVE AN INJECTION OF ZOFRAN for nausea, and also a work excuse. 5)  no med changes 6)  the main problem with gastroenteritis is dehydration. Drink plenty of fluids and take solids as you feel better. If you are unable to keep anything down and/or you show signs of dehydration(dry/cracked lips, lack of tears, not urinating, very sleepy), call our office. Prescriptions: PHENTERMINE HCL 37.5 MG  TABS (PHENTERMINE HCL) Take 1 tablet by mouth once a day  #30 x 2   Entered by:   Everitt Amber LPN   Authorized by:   Syliva Overman MD   Signed by:   Everitt Amber LPN on 25/36/6440   Method used:   Printed then faxed to ...       [facility]* (retail)       [address]       [place], [place]  [address]       Ph: [phone]        Fax: [phone]   RxID:   [id number]    Medication Administration  Injection # 1:    Medication: Zofran 1mg . injection    Diagnosis: NAUSEA (ICD-787.02)    Route: IM    Site: RUOQ gluteus    Exp Date: 05/2011    Lot #: [id number]    Mfr: [email or web address]    Comments: 4mg  given     Patient tolerated injection without complications    Given by: Everitt Amber LPN (March 05, 2010 4:36 PM)  Orders Added: 1)  T-Basic Metabolic Panel [id number] 2)  T-CBC w/Diff [[id number]] 3)  T-Lipid Profile [[id number]] 4)  T-Vitamin D (25-Hydroxy) [94854-62703] 5)  T-Wet Prep by Molecular Probe [[id number]] 6)  T-Chlamydia & GC Probe, Genital [[id number]] 7)  Pap Smear [88150] 8)  Zofran 1mg . injection [J2405] 9)  Admin of Therapeutic Inj  intramuscular or subcutaneous [96372] 10)  Est. Patient 18-39 years [93716]

## 2010-12-25 NOTE — Progress Notes (Signed)
Summary: where rx was sent  Phone Note Call from Patient   Summary of Call: needs to know where meds was sent too [phone] Initial call taken by: Rudene Anda,  March 07, 2010 3:49 PM  Follow-up for Phone Call        wants to know if you were gonna send antibiotic for abcess and somthing for nausea Follow-up by: Adella Hare LPN,  March 07, 2010 4:12 PM  Additional Follow-up for Phone Call Additional follow up Details #1::        pt has abcess in mouth Additional Follow-up by: Syliva Overman MD,  March 08, 2010 8:03 AM    Additional Follow-up for Phone Call Additional follow up Details #2::    pls sched appt for gall bladder ultrasound eval rUQ pain and bloating ,next thurs or friday preferred, in [place] , [place], call her with the appt after 3 this afternoon pls if possible Follow-up by: Syliva Overman MD,  March 08, 2010 8:08 AM  Additional Follow-up for Phone Call Additional follow up Details #3:: Details for Additional Follow-up Action Taken: called pt and left a message that she has appt at  for 03/15/2010 9:00. Npo aftermidnight. and if she has any questions to please call back.  Additional Follow-up by: Rudene Anda,  March 08, 2010 2:59 PM  New/Updated Medications: PENICILLIN V POTASSIUM 500 MG TABS (PENICILLIN V POTASSIUM) Take 1 tablet by mouth three times a day FLUCONAZOLE 150 MG TABS (FLUCONAZOLE) Take 1 tablet by mouth once a day as needed PROMETHAZINE HCL 25 MG TABS (PROMETHAZINE HCL) Take 1 tablet by mouth three times a day as needed DICYCLOMINE HCL 10 MG CAPS (DICYCLOMINE HCL) Take 1 capsule by mouth three times a day as needed Prescriptions: DICYCLOMINE HCL 10 MG CAPS (DICYCLOMINE HCL) Take 1 capsule by mouth three times a day as needed  #30 x 0   Entered and Authorized by:   Syliva Overman MD   Signed by:   Syliva Overman MD on 03/08/2010   Method used:   Electronically to        [facility]* (retail)       [address]       [address] Shipping/mailing       [place], [place]  [address]       Ph: [phone]       Fax: [phone]   RxID:   [id number] PROMETHAZINE HCL 25 MG TABS (PROMETHAZINE HCL) Take 1 tablet by mouth three times a day as needed  #20 x 0   Entered and Authorized by:   Syliva Overman MD   Signed by:   Syliva Overman MD on 03/08/2010   Method used:   Electronically to        [facility]* (retail)       [address]       [address] Shipping/mailing       [place], [place]  [address]       Ph: [phone]       Fax: [phone]   RxID:   [id number] FLUCONAZOLE 150 MG TABS (FLUCONAZOLE) Take 1 tablet by mouth once a day as needed  #3 x 0   Entered and Authorized by:   Syliva Overman MD   Signed by:   Syliva Overman MD on 03/08/2010   Method used:   Electronically to        [facility]* (retail)  [address]       [address] Shipping/mailing       [place], [place]  [address]       Ph: [phone]       Fax: [phone]   RxID:   [id number] PENICILLIN V POTASSIUM 500 MG TABS (PENICILLIN V POTASSIUM) Take 1 tablet by mouth three times a day  #30 x 0   Entered and Authorized by:   Syliva Overman MD   Signed by:   Syliva Overman MD on 03/08/2010   Method used:   Electronically to        [facility]* (retail)       [address]       [address] Shipping/mailing       [place], [place]  [address]       Ph: [phone]       Fax: [phone]   RxID:   [id number]

## 2010-12-25 NOTE — Progress Notes (Signed)
  Phone Note Call from Patient      

## 2010-12-25 NOTE — Progress Notes (Signed)
  Phone Note From Pharmacy   Caller: [facility] Summary of Call: biltricide 600mg  usually does three times a day x 1 day Initial call taken by: Adella Hare LPN,  January 18, 2010 2:09 PM  Follow-up for Phone Call        let the phsarmacy know to d/c the original script and change to the one three times per day #3, i have sent it in, pls Follow-up by: Syliva Overman MD,  January 18, 2010 2:37 PM  Additional Follow-up for Phone Call Additional follow up Details #1::        pharmacy aware Additional Follow-up by: Adella Hare LPN,  January 18, 2010 2:53 PM    New/Updated Medications: BILTRICIDE 600 MG TABS (PRAZIQUANTEL) Take 1 tablet by mouth three times a day Prescriptions: BILTRICIDE 600 MG TABS (PRAZIQUANTEL) Take 1 tablet by mouth three times a day  #3 x 0   Entered and Authorized by:   Syliva Overman MD   Signed by:   Syliva Overman MD on 01/18/2010   Method used:   Electronically to        [facility]* (retail)       [address]       [address] Shipping/mailing       [place], [place]  [address]       Ph: [phone]       Fax: [phone]   RxID:   [id number]

## 2010-12-25 NOTE — Letter (Signed)
Summary: [facility] Scan  [facility]   Imported By: Rudene Anda 03/15/2010 13:55:49  _____________________________________________________________________  External Attachment:    Type:   Image     Comment:   External Document

## 2010-12-25 NOTE — Progress Notes (Signed)
Summary: needs a note  Phone Note Call from Patient   Summary of Call: needs to speak witrh nurse about a note   [phone] Initial call taken by: Rudene Anda,  July 02, 2010 9:43 AM  Follow-up for Phone Call        returned call, left message Follow-up by: Adella Hare LPN,  July 02, 2010 9:50 AM  Additional Follow-up for Phone Call Additional follow up Details #1::        states she needs note for work from seeing you last week Additional Follow-up by: Adella Hare LPN,  July 02, 2010 10:01 AM    Additional Follow-up for Phone Call Additional follow up Details #2::    pls provide wk excuse till she sees the orthopod, I thinkl that appt is today, pls check Follow-up by: Syliva Overman MD,  July 02, 2010 10:34 AM

## 2010-12-25 NOTE — Progress Notes (Signed)
Summary: MEDICINE  Phone Note Call from Patient   Summary of Call: WANTS YOU TO CALL HER ABOUT SOME MEDICINE THAT WAS SUPPOSED TO BE FAXED CALL BACK AT [phone] Initial call taken by: Lind Guest,  Apr 19, 2010 8:26 AM    Prescriptions: PHENTERMINE HCL 37.5 MG TABS (PHENTERMINE HCL) Take 1 tablet by mouth once a day  #30 x 2   Entered by:   Everitt Amber LPN   Authorized by:   Syliva Overman MD   Signed by:   Everitt Amber LPN on 84/13/2440   Method used:   Printed then faxed to ...       [facility]* (retail)       [address]       [address] Shipping/mailing       [place], [place]  [address]       Ph: [phone]       Fax: [phone]   RxID:   [id number]  faxed and called it in to them because patient was there

## 2010-12-25 NOTE — Letter (Signed)
Summary: medical release  medical release   Imported By: Lind Guest 10/26/2010 11:31:20  _____________________________________________________________________  External Attachment:    Type:   Image     Comment:   External Document

## 2011-02-14 LAB — COMPREHENSIVE METABOLIC PANEL
Albumin: 4.2 g/dL (ref 3.5–5.2)
BUN: 9 mg/dL (ref 6–23)
CO2: 27 mEq/L (ref 19–32)
Glucose, Bld: 90 mg/dL (ref 70–99)
Sodium: 140 mEq/L (ref 135–145)

## 2011-03-20 ENCOUNTER — Inpatient Hospital Stay (INDEPENDENT_AMBULATORY_CARE_PROVIDER_SITE_OTHER)
Admission: RE | Admit: 2011-03-20 | Discharge: 2011-03-20 | Disposition: A | Discharge status: Home / Self Care | Payer: 59 | Source: Ambulatory Visit | Attending: Family Medicine | Admitting: Family Medicine

## 2011-03-20 DIAGNOSIS — N390 Urinary tract infection, site not specified: Secondary | ICD-10-CM

## 2011-03-20 LAB — POCT URINALYSIS DIP (DEVICE)
Bilirubin Urine: NEGATIVE
Ketones, ur: NEGATIVE mg/dL
Urobilinogen, UA: 0.2 mg/dL (ref 0.0–1.0)
pH: 7 (ref 5.0–8.0)

## 2011-03-26 ENCOUNTER — Inpatient Hospital Stay (INDEPENDENT_AMBULATORY_CARE_PROVIDER_SITE_OTHER)
Admission: RE | Admit: 2011-03-26 | Discharge: 2011-03-26 | Disposition: A | Discharge status: Home / Self Care | Payer: 59 | Source: Ambulatory Visit | Attending: Family Medicine | Admitting: Family Medicine

## 2011-03-26 DIAGNOSIS — R10819 Abdominal tenderness, unspecified site: Secondary | ICD-10-CM

## 2011-03-26 LAB — POCT I-STAT, CHEM 8
BUN: 13 mg/dL (ref 6–23)
Hemoglobin: 15 g/dL (ref 12.0–15.0)
Potassium: 4.5 mEq/L (ref 3.5–5.1)
Sodium: 140 mEq/L (ref 135–145)
TCO2: 28 mmol/L (ref 0–100)

## 2011-03-26 LAB — POCT URINALYSIS DIP (DEVICE)
Bilirubin Urine: NEGATIVE
Glucose, UA: NEGATIVE mg/dL
Nitrite: NEGATIVE

## 2011-04-09 NOTE — Consult Note (Signed)
Janet Gonzalez, Janet Gonzalez              ACCOUNT NO.:  [id number]   MEDICAL RECORD NO.:  [id number]          PATIENT TYPE:  MAT   LOCATION:  [facility]                          FACILITY:  [facility]   PHYSICIAN:  Lenoard Aden, M.D.DATE OF BIRTH:  17-Feb-1981   DATE OF CONSULTATION:  06/13/2007  DATE OF DISCHARGE:                                 CONSULTATION   EMERGENCY ROOM CONSULTATION:   CHIEF COMPLAINT:  Preterm labor.   She is a 30 year old female, G3, P0, at 91 weeks' gestation, with  increased abdominal cramping noted this past evening.  She is a  nonsmoker, nondrinker.  She denies domestic or physical violence.   Medications are prenatal vitamins.   She has no known drug allergies.   FAMILY HISTORY:  COPD, myocardial infarction, heart disease and  scoliosis.   She has a personal history of a goiter, nontreated.  A history of  gastroesophageal reflux.  History of sickle cell trait.  History of  sciatica and carpal tunnel syndrome.   Pregnancy is remarkable for TAB in 2001 and a TAB in 2003.  Pregnancy  course to date has been uncomplicated.   PHYSICAL EXAMINATION:  She is a well-developed, well-nourished, obese  female in no acute distress.  Blood pressure 130/70.  HEENT:  Normal.  LUNGS:  Clear.  HEART:  Regular rhythm.  ABDOMEN:  Soft, gravid, nontender.  CERVICAL EXAM:  Cervix is deferred due to no contractions noted.  EXTREMITIES:  No cords.  NEUROLOGIC:  Nonfocal.   NST is reassuring with 10 by 10 accelerations and no contractions noted.  Urinalysis is negative.   IMPRESSION:  1. Twenty-seven week intrauterine pregnancy.  2. Lower abdominal cramping, now resolved.  No evidence of preterm      labor.   PLAN:  Discharge home, preterm labor warnings given.  Follow up in the  office as scheduled.      Lenoard Aden, M.D.  Electronically Signed     RJT/MEDQ  D:  06/13/2007  T:  06/13/2007  Job:  [id number]

## 2011-04-12 NOTE — Procedures (Signed)
NAMEKELSA, JAWOROWSKI              ACCOUNT NO.:  [id number]   MEDICAL RECORD NO.:  [id number]          PATIENT TYPE:  OUT   LOCATION:  [facility]                           FACILITY:  [facility]   PHYSICIAN:  Vida Roller, M.D.   DATE OF BIRTH:  21-Aug-1981   DATE OF PROCEDURE:  11/28/2004  DATE OF DISCHARGE:                                  ECHOCARDIOGRAM   PRIMARY CARE PHYSICIAN:  Dr. Lodema Hong.   TAPE NUMBER:  [id number].   TAPE COUNT:  [id number].   HISTORY:  This is a woman with palpitations.  No previous echocardiograms.   DESCRIPTION OF PROCEDURE:  Technical quality is adequate.  M-mode tracings  the aorta is 25 mm.   The left atrium is 40 mm.   The septum is 10 mm.   The posterior wall is 9 mm.   The left ventricular diastolic dimension is 47 mm.   The left ventricular systolic dimension is 35 mm.   2-D AND DOPPLER IMAGING:  The left ventricle is a normal size with normal  systolic function.  There are no wall motion abnormalities seen.  The  overall ejection fraction is 60-65%.   The right ventricle is a normal size with normal systolic function.   Both atria appear to be top normal in size.   The aortic valve is trileaflet tricomisural with no stenosis or  regurgitation.   The mitral valve is morphologically unremarkable with trivial insufficiency.   The tricuspid valve is morphologically unremarkable with trivial  insufficiency.   The pulmonic valve is normal.   There is no pericardial effusion.   The inferior vena cava is a normal size.   The ascending aorta is not well seen.     Trey Paula   JH/MEDQ  D:  11/28/2004  T:  11/28/2004  Job:  [id number]

## 2011-04-12 NOTE — Discharge Summary (Signed)
Janet Gonzalez, Janet Gonzalez              ACCOUNT NO.:  [id number]   MEDICAL RECORD NO.:  [id number]          PATIENT TYPE:  INP   LOCATION:  9109                          FACILITY:  [facility]   PHYSICIAN:  Maxie Better, M.D.DATE OF BIRTH:  04-Apr-1981   DATE OF ADMISSION:  09/14/2007  DATE OF DISCHARGE:  09/18/2007                               DISCHARGE SUMMARY   ADMISSION DIAGNOSES:  1. Post dates.  2. Oligohydramnios.   DISCHARGE DIAGNOSES:  1. Post dates, delivered.  2. Oligohydramnios.   HOSPITAL COURSE:  The patient was admitted for induction of labor  secondary to oligohydramnios at 40-4/7 weeks.  She underwent Cervidil  ripening followed by Pitocin.  The patient subsequently had artificial  rupture of membranes.  She had an epidural for pain management.  She  progressed to full dilatation, pushed well and had a subsequent delivery  of a live female, 7 pounds, 2 ounces over a second degree perineal  laceration and a right vaginal sulcus laceration.  Apgar's were 8 and 9.  The patient had manual placental extraction but otherwise did well.  Her  CBC on postpartum day #1 showed a hemoglobin of 11.0, hematocrit 32.4,  white count 13.2, platelet count 212,000.  The patient did well.  She  complained of some hand edema for which she has bilateral splints and  she was deemed ready for discharge.   DISPOSITION:  Home.   CONDITION ON DISCHARGE:  Stable.   DISCHARGE MEDICATIONS:  1. Prenatal vitamins one p.o. daily.  2. Motrin 600 to 800 mg one p.o. q.6-8 hours p.r.n. pain.   FOLLOWUP:  Followup appointment is at [facility] in six weeks.   DISCHARGE INSTRUCTIONS:  Per the postpartum booklet given.      Maxie Better, M.D.  Electronically Signed     [place]/MEDQ  D:  10/20/2007  T:  10/20/2007  Job:  [id number]

## 2011-06-05 ENCOUNTER — Telehealth: Payer: Self-pay | Admitting: Family Medicine

## 2011-06-07 NOTE — Telephone Encounter (Signed)
Called patient, left message.

## 2011-06-11 NOTE — Telephone Encounter (Signed)
Called patient, no answer 

## 2011-06-20 ENCOUNTER — Telehealth: Payer: Self-pay | Admitting: Family Medicine

## 2011-06-20 NOTE — Telephone Encounter (Signed)
Patient needs appointment, if nothing available she needs to go to urgent care. Patient needs to have pharmacy fax for refill.

## 2011-06-21 ENCOUNTER — Inpatient Hospital Stay (INDEPENDENT_AMBULATORY_CARE_PROVIDER_SITE_OTHER)
Admission: RE | Admit: 2011-06-21 | Discharge: 2011-06-21 | Disposition: A | Discharge status: Home / Self Care | Payer: 59 | Source: Ambulatory Visit | Attending: Family Medicine | Admitting: Family Medicine

## 2011-06-21 DIAGNOSIS — N390 Urinary tract infection, site not specified: Secondary | ICD-10-CM

## 2011-06-21 LAB — POCT URINALYSIS DIP (DEVICE)
Bilirubin Urine: NEGATIVE
Glucose, UA: NEGATIVE mg/dL
Ketones, ur: NEGATIVE mg/dL

## 2011-06-21 LAB — POCT PREGNANCY, URINE: Preg Test, Ur: NEGATIVE

## 2011-07-15 ENCOUNTER — Encounter: Payer: Self-pay | Admitting: Family Medicine

## 2011-07-15 ENCOUNTER — Telehealth: Payer: Self-pay | Admitting: Family Medicine

## 2011-07-15 ENCOUNTER — Ambulatory Visit (INDEPENDENT_AMBULATORY_CARE_PROVIDER_SITE_OTHER): Payer: 59 | Admitting: Family Medicine

## 2011-07-15 ENCOUNTER — Other Ambulatory Visit (HOSPITAL_COMMUNITY)
Admission: RE | Admit: 2011-07-15 | Discharge: 2011-07-15 | Disposition: A | Discharge status: Home / Self Care | Payer: 59 | Source: Ambulatory Visit | Attending: Family Medicine | Admitting: Family Medicine

## 2011-07-15 DIAGNOSIS — Z0000 Encounter for general adult medical examination without abnormal findings: Secondary | ICD-10-CM

## 2011-07-15 DIAGNOSIS — Z124 Encounter for screening for malignant neoplasm of cervix: Secondary | ICD-10-CM

## 2011-07-15 DIAGNOSIS — D259 Leiomyoma of uterus, unspecified: Secondary | ICD-10-CM

## 2011-07-15 DIAGNOSIS — K044 Acute apical periodontitis of pulpal origin: Secondary | ICD-10-CM

## 2011-07-15 DIAGNOSIS — K047 Periapical abscess without sinus: Secondary | ICD-10-CM

## 2011-07-15 DIAGNOSIS — Z01419 Encounter for gynecological examination (general) (routine) without abnormal findings: Secondary | ICD-10-CM | POA: Insufficient documentation

## 2011-07-15 DIAGNOSIS — E049 Nontoxic goiter, unspecified: Secondary | ICD-10-CM

## 2011-07-15 DIAGNOSIS — E669 Obesity, unspecified: Secondary | ICD-10-CM

## 2011-07-15 DIAGNOSIS — Z20828 Contact with and (suspected) exposure to other viral communicable diseases: Secondary | ICD-10-CM

## 2011-07-15 DIAGNOSIS — Z7251 High risk heterosexual behavior: Secondary | ICD-10-CM

## 2011-07-15 DIAGNOSIS — R5381 Other malaise: Secondary | ICD-10-CM

## 2011-07-15 MED ORDER — FLUCONAZOLE 150 MG PO TABS: 150.0000 mg | ORAL_TABLET | Freq: Once | ORAL | Status: AC

## 2011-07-15 MED ORDER — AMOXICILLIN 500 MG PO CAPS: 500.0000 mg | ORAL_CAPSULE | Freq: Three times a day (TID) | ORAL | Status: AC

## 2011-07-15 MED ORDER — ETONOGESTREL-ETHINYL ESTRADIOL 0.12-0.015 MG/24HR VA RING: 1.0000 | VAGINAL_RING | VAGINAL | Status: DC

## 2011-07-15 NOTE — Progress Notes (Signed)
  Subjective:    Patient ID: Janet Gonzalez, female    DOB: [birth year], 30 y.o.   MRN: [id number]  HPI  Here for annual exam, wants STD screening, including HIV    Thyroid surgery- Feb 2011, has not had levels, has gained weight since the surgery, lack of energy, cold intolerance, fatigue, denies chest pain  Wants diabetes check, CBC check  OCP- ran out of birth control needs Nuva ring, had unprotected sex this past weekend  Fibroid- last ultrasound was in 2008, has had very heavy bleeding monthly, espcially off OCP, would like this checked, no pelvic pain with intercourse. Last menstrual period ended 8 /16/12 Obesity- pt wanted to restart phenterimine if labs were normal. Works as Engineer, civil (consulting)   Review of Systems   GEN- + fatigue,denies  fever, + weight gain , denies weakness, recent illness HEENT- denies eye drainage, change in vision, nasal discharge, CVS- denies chest pain, palpitations RESP- denies SOB, cough, wheeze ABD- denies N/V, change in stools, abd pain GU- denies dysuria, hematuria, dribbling, incontinence MSK- denies joint pain, muscle aches, injury Neuro- denies headache, dizziness, syncope, seizure activity      Objective:   Physical Exam GEN- NAD, alert and oriented x3 HEENT- PERRL, EOMI, non injected sclera, pink conjunctiva, MMM, oropharynx clear, erythema of upper right gumline- cavity noted in upper molar, TTP, brittle tooth, no discrete abscess Neck- Supple, no thryomegaly CVS- RRR, no murmur RESP-CTAB Breast- no nodularity felt, no skin changes, no nipple discharge, no inversion of nipple, breast symmetric Lymph- no axillary nodes GU- normal external genitalia, no external lesions, cervix visualized no lesions noted, clear discharge, non odorous, no CMT, no ovarian mass, no large fibroid felt EXT- No edema Pulses- Radial, DP- 2+        Assessment & Plan:   Physical exam- Pap smear and exam performed. Cultures were also sent for STD check and patient  will have HIV and RPR done at  Contraception- urine pregnancy test Was negative today. I advised patient she should take another pregnancy test approximately 2-3 weeks. As we aren't sure of when she ovulates that she has been on contraception for some time. She decided she will start the new for ring after that negative pregnancy test. She will start a multivitamin  Exposure to STD- discussed use of condoms at all times. Labs were drawn  Goiter- patient is status post surgical intervention. I will obtain a TSH to follow her thyroid levels. She has complaints that are very concerning for hypothyroidism. We'll check CBC, TSH, BMET for fatigue and other symptoms  Fibroid uterus- her uterus did not appear that large on exam today. With her history of heavy bleeding and history of thyroid disorder we'll go ahead and get ultrasound to evaluate.  New patient wanted to know if she needs to have another dose of antiparasitic medication as she had history of paperwork. She does not have any concerns regarding her stools or abdominal pain  Therefore I told her this was not needed at this time

## 2011-07-15 NOTE — Patient Instructions (Signed)
For your tooth- start the amoxicillin Please set up a dentist appointment ASAP For your thyroid we will check labs  Do not eat after midnight before labs are drawn- Lipid panel will be done We will set up an ultrasound for your fibroids Work on weight loss, aerobic exercise 3-5x a week, goal 1-2 pounds a week, start a low carb diet.

## 2011-07-15 NOTE — Telephone Encounter (Signed)
Will send in 

## 2011-07-15 NOTE — Assessment & Plan Note (Signed)
Janet Gonzalez  start patient on amoxicillin . She Janet Gonzalez schedule a dental visit soon to have tooth removed

## 2011-07-15 NOTE — Assessment & Plan Note (Signed)
We'll review labs and discuss with her PCP. If normal then will restart her on the phenterimine. Encouraged continued exercise program which she currently has 3 times a week. Diet also discussed

## 2011-07-16 ENCOUNTER — Encounter: Payer: Self-pay | Admitting: Family Medicine

## 2011-07-16 LAB — BASIC METABOLIC PANEL
CO2: 27 mEq/L (ref 19–32)
Chloride: 107 mEq/L (ref 96–112)
Creat: 0.7 mg/dL (ref 0.50–1.10)

## 2011-07-16 LAB — WET PREP BY MOLECULAR PROBE
Gardnerella vaginalis: NEGATIVE
Trichomonas vaginosis: NEGATIVE

## 2011-07-16 LAB — LIPID PANEL
Cholesterol: 149 mg/dL (ref 0–200)
LDL Cholesterol: 86 mg/dL (ref 0–99)
Triglycerides: 61 mg/dL (ref <150)

## 2011-07-16 LAB — GC/CHLAMYDIA PROBE AMP, GENITAL: Chlamydia, DNA Probe: NEGATIVE

## 2011-07-16 LAB — TSH: TSH: 3.316 u[IU]/mL (ref 0.350–4.500)

## 2011-07-16 LAB — CBC
HCT: 40.5 % (ref 36.0–46.0)
MCV: 89.2 fL (ref 78.0–100.0)
Platelets: 262 10*3/uL (ref 150–400)
RBC: 4.54 MIL/uL (ref 3.87–5.11)
WBC: 3.6 10*3/uL — ABNORMAL LOW (ref 4.0–10.5)

## 2011-07-18 ENCOUNTER — Ambulatory Visit (HOSPITAL_COMMUNITY): Payer: 59

## 2011-07-18 ENCOUNTER — Other Ambulatory Visit: Payer: Self-pay | Admitting: Family Medicine

## 2011-07-18 ENCOUNTER — Encounter: Payer: Self-pay | Admitting: Family Medicine

## 2011-07-18 MED ORDER — PHENTERMINE HCL 37.5 MG PO CAPS: 37.5000 mg | ORAL_CAPSULE | ORAL | Status: DC

## 2011-07-19 ENCOUNTER — Telehealth: Payer: Self-pay | Admitting: Family Medicine

## 2011-07-19 ENCOUNTER — Other Ambulatory Visit (HOSPITAL_COMMUNITY): Payer: 59

## 2011-07-19 ENCOUNTER — Ambulatory Visit (HOSPITAL_COMMUNITY)
Admission: RE | Admit: 2011-07-19 | Discharge: 2011-07-19 | Disposition: A | Discharge status: Home / Self Care | Payer: 59 | Source: Ambulatory Visit | Attending: Family Medicine | Admitting: Family Medicine

## 2011-07-19 DIAGNOSIS — D259 Leiomyoma of uterus, unspecified: Secondary | ICD-10-CM

## 2011-07-19 DIAGNOSIS — N938 Other specified abnormal uterine and vaginal bleeding: Secondary | ICD-10-CM | POA: Insufficient documentation

## 2011-07-19 DIAGNOSIS — N949 Unspecified condition associated with female genital organs and menstrual cycle: Secondary | ICD-10-CM | POA: Insufficient documentation

## 2011-07-19 NOTE — Telephone Encounter (Signed)
Results have been mailed to patient

## 2011-07-22 ENCOUNTER — Other Ambulatory Visit: Payer: Self-pay

## 2011-07-22 MED ORDER — PHENTERMINE HCL 37.5 MG PO CAPS: 37.5000 mg | ORAL_CAPSULE | ORAL | Status: DC

## 2011-09-02 ENCOUNTER — Encounter: Payer: Self-pay | Admitting: Family Medicine

## 2011-09-02 ENCOUNTER — Ambulatory Visit (INDEPENDENT_AMBULATORY_CARE_PROVIDER_SITE_OTHER): Payer: 59 | Admitting: Family Medicine

## 2011-09-02 ENCOUNTER — Telehealth: Payer: Self-pay | Admitting: Family Medicine

## 2011-09-02 VITALS — BP 120/70 | HR 80 | Temp 99.1°F | Resp 16 | Ht 65.0 in | Wt 198.8 lb

## 2011-09-02 DIAGNOSIS — E669 Obesity, unspecified: Secondary | ICD-10-CM

## 2011-09-02 DIAGNOSIS — J029 Acute pharyngitis, unspecified: Secondary | ICD-10-CM

## 2011-09-02 MED ORDER — PENICILLIN V POTASSIUM 500 MG PO TABS: 500.0000 mg | ORAL_TABLET | Freq: Three times a day (TID) | ORAL | Status: DC

## 2011-09-02 MED ORDER — PENICILLIN V POTASSIUM 125 MG/5ML PO SOLR: ORAL | Status: AC

## 2011-09-02 MED ORDER — FLUCONAZOLE 150 MG PO TABS: 150.0000 mg | ORAL_TABLET | Freq: Once | ORAL | Status: AC

## 2011-09-02 MED ORDER — PENICILLIN V POTASSIUM 125 MG/5ML PO SOLR: ORAL | Status: DC

## 2011-09-02 MED ORDER — FIRST-DUKES MOUTHWASH MT SUSP: OROMUCOSAL | Status: DC

## 2011-09-02 NOTE — Telephone Encounter (Signed)
Patient is being seen at 2pm today

## 2011-09-02 NOTE — Patient Instructions (Addendum)
F/u in January.  You are being treated for presumed strep infection, pls ensure you take entire course of antibiotiics prescribed.  Work excuse to return on 09/04/2011  It is important that you exercise regularly at least 30 minutes 5 times a week. If you develop chest pain, have severe difficulty breathing, or feel very tired, stop exercising immediately and seek medical attention  .  A healthy diet is rich in fruit, vegetables and whole grains. Poultry fish, nuts and beans are a healthy choice for protein rather then red meat. A low sodium diet and drinking 64 ounces of water daily is generally recommended. Oils and sweet should be limited. Carbohydrates especially for those who are diabetic or overweight, should be limited to 30-45 gram per meal. It is important to eat on a regular schedule, at least 3 times daily. Snacks should be primarily fruits, vegetables or nuts.  Weight loss goal of 10 to 12 pounds

## 2011-09-02 NOTE — Telephone Encounter (Signed)
Work in today

## 2011-09-02 NOTE — Telephone Encounter (Signed)
Do you want to work this patient in or advise ER?

## 2011-09-03 ENCOUNTER — Telehealth: Payer: Self-pay | Admitting: Family Medicine

## 2011-09-03 MED ORDER — CEFTRIAXONE SODIUM 500 MG IJ SOLR
500.0000 mg | Freq: Once | INTRAMUSCULAR | Status: AC
  Administered 2011-09-03: 500 mg via INTRAMUSCULAR

## 2011-09-03 NOTE — Telephone Encounter (Signed)
pls refer to en today for tonsillitis

## 2011-09-03 NOTE — Telephone Encounter (Signed)
Patient aware.

## 2011-09-03 NOTE — Telephone Encounter (Signed)
Pt has appt with dr. Andrey Campanile in [place] for 09/03/2011 2:45. Pt is aware

## 2011-09-03 NOTE — Progress Notes (Signed)
  Subjective:    Patient ID: Janet Gonzalez, female    DOB: [birth year], 30 y.o.   MRN: [id number]  HPI 3 day h/o sore throat, chills, fever to 101 and body aches , prior to this she had been well. Denies sinus pressure, ear pain or productive cough. She has been started back on phentermine for help with weight loss, and has worked on dietary change with a little success.    Review of Systems See HPI  Denies sinus pressure or  nasal congestion, . Denies chest congestion, productive cough or wheezing. Denies chest pains, palpitations and leg swelling Denies abdominal pain, nausea, vomiting,diarrhea or constipation.   Denies dysuria, frequency, hesitancy or incontinence.  Denies headaches, seizures, numbness, or tingling. Denies depression, anxiety or insomnia. Denies skin break down or rash.        Objective:   Physical Exam  Patient alert and oriented and in no cardiopulmonary distress.ill appearing and in pain  HEENT: No facial asymmetry, EOMI, no sinus tenderness,  oropharynx erythematous, exudate on left tonsil.  Neck supple bilateral adenopathy.  Chest: Clear to auscultation bilaterally.  CVS: S1, S2 no murmurs, no S3.  ABD: Soft non tender. Bowel sounds normal.  Ext: No edema  MS: Adequate ROM spine, shoulders, hips and knees.  Skin: Intact, no ulcerations or rash noted.  Psych: Good eye contact, normal affect. Memory intact not anxious or depressed appearing.  CNS: CN 2-12 intact, power, tone and sensation normal throughout.       Assessment & Plan:

## 2011-09-03 NOTE — Telephone Encounter (Signed)
pls advise pt to come by for TdaP when able.  She is being referred for ent eval today if possible, needs eval today  Work excuse to be extended to return 09/09/2011

## 2011-09-03 NOTE — Assessment & Plan Note (Signed)
History and exam consistent with strep pharyngitis, will treat and isolate x 24 hrs, rocephin administered, and penicillin prescribed

## 2011-09-03 NOTE — Telephone Encounter (Signed)
Spoke with pt  She has f/u with ENT in 2 days being treated as viral, he has ordered lobs, as well as intends to extend work excuse. Pt to come by for TDaP

## 2011-09-03 NOTE — Assessment & Plan Note (Signed)
Improved. Pt applauded on succesful weight loss through lifestyle change, and encouraged to continue same. Weight loss goal set for the next several months.  

## 2011-09-04 LAB — CBC
Hemoglobin: 11 — ABNORMAL LOW
MCHC: 34.3
MCV: 88.4
Platelets: 212
Platelets: 250
RDW: 14.5 — ABNORMAL HIGH
RDW: 15 — ABNORMAL HIGH
WBC: 10.8 — ABNORMAL HIGH

## 2011-09-04 LAB — RPR: RPR Ser Ql: NONREACTIVE

## 2011-09-09 LAB — URINALYSIS, ROUTINE W REFLEX MICROSCOPIC
Glucose, UA: NEGATIVE
pH: 6

## 2012-03-17 ENCOUNTER — Ambulatory Visit (INDEPENDENT_AMBULATORY_CARE_PROVIDER_SITE_OTHER): Payer: 59 | Admitting: Family Medicine

## 2012-03-17 ENCOUNTER — Encounter: Payer: Self-pay | Admitting: Family Medicine

## 2012-03-17 ENCOUNTER — Telehealth: Payer: Self-pay | Admitting: Family Medicine

## 2012-03-17 DIAGNOSIS — J40 Bronchitis, not specified as acute or chronic: Secondary | ICD-10-CM

## 2012-03-17 DIAGNOSIS — J019 Acute sinusitis, unspecified: Secondary | ICD-10-CM

## 2012-03-17 MED ORDER — LORATADINE 10 MG PO TABS: 10.0000 mg | ORAL_TABLET | Freq: Every day | ORAL | Status: DC

## 2012-03-17 MED ORDER — AMOXICILLIN-POT CLAVULANATE 875-125 MG PO TABS: 1.0000 | ORAL_TABLET | Freq: Two times a day (BID) | ORAL | Status: AC

## 2012-03-17 MED ORDER — CHLORPHENIRAMINE-HYDROCODONE 8-10 MG/5ML PO LQCR: 5.0000 mL | Freq: Two times a day (BID) | ORAL | Status: DC | PRN

## 2012-03-17 NOTE — Patient Instructions (Signed)
I am treating for sinusitis. Take the antibiotics as prescribed Try the allergy medication F/U if no improvement

## 2012-03-17 NOTE — Progress Notes (Signed)
  Subjective:    Patient ID: Janet Gonzalez, female    DOB: Oct 10, 1981, 31 y.o.   MRN: [id number]  HPI  Sinus drainage cough with production x1 week. Patient works as a Engineer, civil (consulting) at the [employer]. Denies fever positive fatigue and sore throat as well as a headache. She has tried over-the-counter medications for her headache which has helped some.   Review of Systems - per above  GEN- + fatigue, fever, weight loss,weakness, recent illness HEENT- denies eye drainage, change in vision, nasal discharge,+congestion, +ear pressure CVS- denies chest pain, palpitations RESP- denies SOB,+ cough, wheeze, +pleuritic chest pain ABD- + N/ no V, change in stools, abd pain GU- denies dysuria, hematuria, dribbling, incontinence MSK- denies joint pain, +muscle aches, injury Neuro- + headache, dizziness, syncope, seizure activity      Objective:   Physical Exam  GEN- NAD, alert and oriented x3, Hoarse HEENT- PERRL, EOMI, non injected sclera, pink conjunctiva, MMM, oropharynx mild injection Right TM clear effusion, no erythema of Tm, Left TM clear, +sinus pressure Neck- Supple, no LAD CVS- RRR, no murmur RESP-CTAB, upper airway congestion EXT- No edema Pulses- Radial, DP- 2+        Assessment & Plan:   Sinusitis- antibiotics prescribed, trial of Claritin for allergies  Bronchitis- per above antibiotics, cough syrup, works in high risk area, given note for work., steroids if no improvement, no history of asthma,

## 2012-03-17 NOTE — Telephone Encounter (Signed)
States she needs to be seen. Transferred up front for appt

## 2012-03-18 MED ORDER — FLUCONAZOLE 150 MG PO TABS: 150.0000 mg | ORAL_TABLET | Freq: Once | ORAL | Status: AC

## 2012-03-18 NOTE — Progress Notes (Signed)
Addended by: Milinda Antis F on: 03/18/2012 10:03 AM   Modules accepted: Orders

## 2012-03-27 ENCOUNTER — Ambulatory Visit (HOSPITAL_COMMUNITY)
Admission: RE | Admit: 2012-03-27 | Discharge: 2012-03-27 | Disposition: A | Discharge status: Home / Self Care | Payer: 59 | Source: Ambulatory Visit | Attending: Family Medicine | Admitting: Family Medicine

## 2012-03-27 ENCOUNTER — Telehealth: Payer: Self-pay | Admitting: Family Medicine

## 2012-03-27 DIAGNOSIS — R059 Cough, unspecified: Secondary | ICD-10-CM

## 2012-03-27 DIAGNOSIS — Z87891 Personal history of nicotine dependence: Secondary | ICD-10-CM | POA: Insufficient documentation

## 2012-03-27 DIAGNOSIS — R05 Cough: Secondary | ICD-10-CM

## 2012-03-27 NOTE — Telephone Encounter (Signed)
Pt aware.

## 2012-03-27 NOTE — Telephone Encounter (Signed)
Please advise 

## 2012-03-27 NOTE — Telephone Encounter (Signed)
Please have her get an x-ray at [facility] , and I will decide if she needs other meds

## 2012-04-02 ENCOUNTER — Encounter: Payer: 59 | Admitting: Family Medicine

## 2012-04-03 NOTE — Telephone Encounter (Signed)
Normal letter sent

## 2012-04-06 ENCOUNTER — Encounter: Payer: Self-pay | Admitting: Family Medicine

## 2012-04-06 ENCOUNTER — Ambulatory Visit (INDEPENDENT_AMBULATORY_CARE_PROVIDER_SITE_OTHER): Payer: 59 | Admitting: Family Medicine

## 2012-04-06 VITALS — BP 122/68 | HR 68 | Resp 18 | Ht 65.0 in | Wt 193.0 lb

## 2012-04-06 DIAGNOSIS — M549 Dorsalgia, unspecified: Secondary | ICD-10-CM

## 2012-04-06 DIAGNOSIS — M25559 Pain in unspecified hip: Secondary | ICD-10-CM

## 2012-04-06 DIAGNOSIS — Z13220 Encounter for screening for lipoid disorders: Secondary | ICD-10-CM

## 2012-04-06 DIAGNOSIS — M542 Cervicalgia: Secondary | ICD-10-CM

## 2012-04-06 DIAGNOSIS — E049 Nontoxic goiter, unspecified: Secondary | ICD-10-CM

## 2012-04-06 DIAGNOSIS — N760 Acute vaginitis: Secondary | ICD-10-CM

## 2012-04-06 DIAGNOSIS — R5381 Other malaise: Secondary | ICD-10-CM

## 2012-04-06 DIAGNOSIS — F17200 Nicotine dependence, unspecified, uncomplicated: Secondary | ICD-10-CM

## 2012-04-06 DIAGNOSIS — R5383 Other fatigue: Secondary | ICD-10-CM

## 2012-04-06 DIAGNOSIS — J329 Chronic sinusitis, unspecified: Secondary | ICD-10-CM | POA: Insufficient documentation

## 2012-04-06 MED ORDER — FLUCONAZOLE 150 MG PO TABS: ORAL_TABLET | ORAL | Status: AC

## 2012-04-06 MED ORDER — PREDNISONE (PAK) 5 MG PO TABS: 5.0000 mg | ORAL_TABLET | ORAL | Status: DC

## 2012-04-06 MED ORDER — ETONOGESTREL-ETHINYL ESTRADIOL 0.12-0.015 MG/24HR VA RING: 1.0000 | VAGINAL_RING | VAGINAL | Status: DC

## 2012-04-06 MED ORDER — KETOROLAC TROMETHAMINE 60 MG/2ML IJ SOLN
60.0000 mg | Freq: Once | INTRAMUSCULAR | Status: AC
  Administered 2012-04-06: 60 mg via INTRAMUSCULAR

## 2012-04-06 MED ORDER — SULFAMETHOXAZOLE-TRIMETHOPRIM 800-160 MG PO TABS: 1.0000 | ORAL_TABLET | Freq: Two times a day (BID) | ORAL | Status: AC

## 2012-04-06 MED ORDER — IBUPROFEN 800 MG PO TABS: 800.0000 mg | ORAL_TABLET | Freq: Three times a day (TID) | ORAL | Status: AC | PRN

## 2012-04-06 MED ORDER — METHYLPREDNISOLONE ACETATE 80 MG/ML IJ SUSP
80.0000 mg | Freq: Once | INTRAMUSCULAR | Status: AC
  Administered 2012-04-06: 80 mg via INTRAMUSCULAR

## 2012-04-06 NOTE — Assessment & Plan Note (Signed)
Specimens sent for testing 

## 2012-04-06 NOTE — Assessment & Plan Note (Signed)
Reduced but needs to quit

## 2012-04-06 NOTE — Patient Instructions (Addendum)
F/u and pelvic exam end August. You are referred to physical therapy for chronic neck and upper back pain. Toradol 60mg  IM and depo medrol 80mg  iM in office, and medication also sent in  Weight loss goal of 10 pounds.  It is important that you exercise regularly at least 30 minutes 5 times a week. If you develop chest pain, have severe difficulty breathing, or feel very tired, stop exercising immediately and seek medical attention  A healthy diet is rich in fruit, vegetables and whole grains. Poultry fish, nuts and beans are a healthy choice for protein rather then red meat. A low sodium diet and drinking 64 ounces of water daily is generally recommended. Oils and sweet should be limited. Carbohydrates especially for those who are diabetic or overweight, should be limited to 34-45 gram per meal. It is important to eat on a regular schedule, at least 3 times daily. Snacks should be primarily fruits, vegetables or nuts.  You need to stop smoking ENTIRELY   You will be treated for sinusitis with 3 weeks antibiotics, please take all medication prescribed. Fluconazole is also sent in for vaginal yeast infection if you develop one  Please resume birth control, th nuva ring will be sent in, also use condoms as back up protection for STD as well as because of antibiotic course  CBC, fasting lipid, cmp , HBA1C and vit D and TSH in August 1 week before f/u

## 2012-04-06 NOTE — Assessment & Plan Note (Signed)
Increased in 6 to 8 months, disturbing sleep

## 2012-04-06 NOTE — Assessment & Plan Note (Signed)
Recently treated with short duration of meds and never really subsided, will prescribe 3 week course, if fails again will need sinus scan

## 2012-04-06 NOTE — Assessment & Plan Note (Signed)
Increased will refer for therapy

## 2012-04-06 NOTE — Progress Notes (Signed)
  Subjective:    Patient ID: Janet Gonzalez, female    DOB: 1980-11-28, 31 y.o.   MRN: [id number]  HPI The PT is here for re-evaluation of chronic medical conditions, medication management and review of any available recent lab and radiology data.  Preventive health is updated, specifically  Cancer screening and Immunization.    The PT denies any adverse reactions to current medications since the last visit.  Chronic neck and upper extremity pain with tingling in both hands, disrupts sleep, no noted weakness, no comfortable pillow Right hip paind own leg from back following zumba class the day before Smokes occasionally. Recurrent green sinus drainage in the past week although recently treated for 1 week, also intermittent chills. Plans to be more consistent with diet and exercise for sucesful weight loss C/o malodorous vaginal d/c wants to be checked for infection, also needs to resume contraceptives     Review of Systems See HPI Denies recent fever or chills. Denies sinus pressure, nasal congestion, ear pain or sore throat. Denies chest congestion, productive cough or wheezing. Denies chest pains, palpitations and leg swelling Denies abdominal pain, nausea, vomiting,diarrhea or constipation.   Denies dysuria, frequency, hesitancy or incontinence.  Denies headaches, seizures, numbness, or tingling. Denies depression, anxiety or insomnia. Denies skin break down or rash.        Objective:   Physical Exam  Patient alert and oriented and in no cardiopulmonary distress.  HEENT: No facial asymmetry, EOMI, no sinus tenderness,  oropharynx pink and moist.  Neck decreased ROM  no adenopathy.  Chest: Clear to auscultation bilaterally.  CVS: S1, S2 no murmurs, no S3.  ABD: Soft non tender. Bowel sounds normal. Pelvic; malodorous vaginal d/c , no cervical motion or adnexal tenderness  Ext: No edema  MS: decreased  ROM spine, and  hips and adequate in  knees.  Skin: Intact,  no ulcerations or rash noted.  Psych: Good eye contact, normal affect. Memory intact not anxious or depressed appearing.  CNS: CN 2-12 intact, power, tone and sensation normal throughout.`       Assessment & Plan:

## 2012-04-06 NOTE — Assessment & Plan Note (Signed)
Acute flare x 2 days, toradol and depo medrol in office and meds sent in, pt also counseled against extreme exercise in zumba or other exercise classes

## 2012-04-07 LAB — WET PREP BY MOLECULAR PROBE
Candida species: NEGATIVE
Gardnerella vaginalis: POSITIVE — AB

## 2012-04-07 LAB — GC/CHLAMYDIA PROBE AMP, GENITAL: Chlamydia, DNA Probe: NEGATIVE

## 2012-04-17 ENCOUNTER — Other Ambulatory Visit: Payer: Self-pay

## 2012-04-17 MED ORDER — METRONIDAZOLE 500 MG PO TABS: 500.0000 mg | ORAL_TABLET | Freq: Two times a day (BID) | ORAL | Status: AC

## 2012-05-25 ENCOUNTER — Telehealth: Payer: Self-pay | Admitting: Family Medicine

## 2012-06-01 ENCOUNTER — Ambulatory Visit (INDEPENDENT_AMBULATORY_CARE_PROVIDER_SITE_OTHER): Payer: 59 | Admitting: Family Medicine

## 2012-06-01 ENCOUNTER — Encounter: Payer: Self-pay | Admitting: Family Medicine

## 2012-06-01 VITALS — BP 120/70 | HR 83 | Resp 16 | Ht 65.0 in | Wt 197.0 lb

## 2012-06-01 DIAGNOSIS — N760 Acute vaginitis: Secondary | ICD-10-CM

## 2012-06-01 DIAGNOSIS — Z113 Encounter for screening for infections with a predominantly sexual mode of transmission: Secondary | ICD-10-CM

## 2012-06-01 DIAGNOSIS — N3000 Acute cystitis without hematuria: Secondary | ICD-10-CM

## 2012-06-01 DIAGNOSIS — F17200 Nicotine dependence, unspecified, uncomplicated: Secondary | ICD-10-CM

## 2012-06-01 DIAGNOSIS — E669 Obesity, unspecified: Secondary | ICD-10-CM

## 2012-06-01 DIAGNOSIS — N920 Excessive and frequent menstruation with regular cycle: Secondary | ICD-10-CM | POA: Insufficient documentation

## 2012-06-01 MED ORDER — IBUPROFEN 800 MG PO TABS: ORAL_TABLET | ORAL | Status: DC

## 2012-06-01 NOTE — Patient Instructions (Addendum)
CPE after 07/15/2012 as before.  Specimens will be sent for testing including blood tests as discussed.  Please commit to and start daily exercise for 45 minutes to 1 hour  Please make changes in social habits as far as alcohol use is concerned , as we discussed  You need to quit smoking totally, this will reduce your risk of cancer, heart disease and stroke  Ibuprofen is sent in for menstrual pain

## 2012-06-01 NOTE — Telephone Encounter (Signed)
Came in for appt today.
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
## 2012-06-03 LAB — GC/CHLAMYDIA PROBE AMP, GENITAL: GC Probe Amp, Genital: NEGATIVE

## 2012-06-03 LAB — WET PREP BY MOLECULAR PROBE: Gardnerella vaginalis: NEGATIVE

## 2012-06-03 MED ORDER — METRONIDAZOLE 500 MG PO TABS: 500.0000 mg | ORAL_TABLET | Freq: Two times a day (BID) | ORAL | Status: DC

## 2012-06-21 NOTE — Assessment & Plan Note (Signed)
Deteriorated. Patient re-educated about  the importance of commitment to a  minimum of 150 minutes of exercise per week. The importance of healthy food choices with portion control discussed. Encouraged to start a food diary, count calories and to consider  joining a support group. Sample diet sheets offered. Goals set by the patient for the next several months.    

## 2012-06-21 NOTE — Assessment & Plan Note (Signed)
Ibuprofen prescribed for help with flow as well as pain

## 2012-06-21 NOTE — Assessment & Plan Note (Signed)
Cessation counselling done, no quit date set Patient counseled for approximately 5 minutes regarding the health risks of ongoing nicotine use, specifically all types of cancer, heart disease, stroke and respiratory failure. The options available for help with cessation ,the behavioral changes to assist the process, and the option to either gradully reduce usage  Or abruptly stop.is also discussed. Pt is also encouraged to set specific goals in number of cigarettes used daily, as well as to set a quit date.

## 2012-06-21 NOTE — Assessment & Plan Note (Signed)
Specimens sent for testing based on pt concern and request for all STD, both in the blood and in vaginal /cervical specimens. .Safe sex counseling and limiting alcohol discussed and encouraged

## 2012-07-14 ENCOUNTER — Encounter: Payer: 59 | Admitting: Family Medicine

## 2012-08-19 ENCOUNTER — Ambulatory Visit: Payer: 59 | Admitting: Family Medicine

## 2012-11-23 ENCOUNTER — Emergency Department (HOSPITAL_COMMUNITY)
Admission: EM | Admit: 2012-11-23 | Discharge: 2012-11-23 | Disposition: A | Discharge status: Home / Self Care | Payer: 59 | Source: Home / Self Care | Attending: Emergency Medicine | Admitting: Emergency Medicine

## 2012-11-23 ENCOUNTER — Emergency Department (INDEPENDENT_AMBULATORY_CARE_PROVIDER_SITE_OTHER): Payer: 59

## 2012-11-23 ENCOUNTER — Encounter (HOSPITAL_COMMUNITY): Payer: Self-pay | Admitting: *Deleted

## 2012-11-23 DIAGNOSIS — J111 Influenza due to unidentified influenza virus with other respiratory manifestations: Secondary | ICD-10-CM

## 2012-11-23 LAB — POCT RAPID STREP A: Streptococcus, Group A Screen (Direct): NEGATIVE

## 2012-11-23 MED ORDER — OSELTAMIVIR PHOSPHATE 75 MG PO CAPS
75.0000 mg | ORAL_CAPSULE | Freq: Two times a day (BID) | ORAL | Status: DC
Start: 2012-11-23 — End: 2013-01-21

## 2012-11-23 MED ORDER — HYDROCOD POLST-CHLORPHEN POLST 10-8 MG/5ML PO LQCR: 5.0000 mL | Freq: Two times a day (BID) | ORAL | Status: DC | PRN

## 2012-11-23 NOTE — ED Notes (Signed)
Pt  Reports  Symptoms  Of  Body  Aches    Chills  stuffyness         Headache      r  Earache  X  8  Days   Fever  However  Started  2  Days  Ago

## 2012-11-23 NOTE — ED Provider Notes (Signed)
Chief Complaint  Patient presents with  . 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 She has gotten her influenza shot earlier this year. For the past week she's had a dry cough, chest tightness, and chest pain. Over the past 3 days she's felt feverish, chilled, achy all over, had a headache, sore throat, temperature of up to 102.7, right ear pain, shortness of breath, nasal congestion, rhinorrhea, sneezing, and dizziness. She denies any GI complaints.  Review of Systems:  Other than noted above, the patient denies any of the following symptoms. Systemic:  No fever, chills, sweats, fatigue, myalgias, headache, or anorexia. Eye:  No redness, pain or drainage. ENT:  No earache, ear congestion, nasal congestion, sneezing, rhinorrhea, sinus pressure, sinus pain, post nasal drip, or sore throat. Lungs:  No cough, sputum production, wheezing, shortness of breath, or chest pain. GI:  No abdominal pain, nausea, vomiting, or diarrhea.  PMFSH:  Past medical history, family history, social history, meds, and allergies were reviewed.  Physical Exam:   Vital signs:  BP 128/93  Pulse 87  Temp 100.4 F (38 C) (Oral)  Resp 16  SpO2 100%  LMP 11/23/2012 General:  Alert, in no distress. Eye:  No conjunctival injection or drainage. Lids were normal. ENT:  TMs and canals were normal, without erythema or inflammation.  Nasal mucosa was clear and uncongested, without drainage.  Mucous membranes were moist.  Pharynx was clear, without exudate or drainage.  There were no oral ulcerations or lesions. Neck:  Supple, no adenopathy, tenderness or mass. Lungs:  No respiratory distress.  Lungs were clear to auscultation, without wheezes, rales or rhonchi.  Breath sounds were clear and equal bilaterally.  Heart:  Regular rhythm, without gallops, murmers or rubs. Skin:  Clear, warm, and dry,  without rash or lesions.  Labs:   Results for orders placed during the hospital encounter of 11/23/12  POCT RAPID STREP A ([facility] ONLY)      Component Value Range   Streptococcus, Group A Screen (Direct) NEGATIVE  NEGATIVE    Radiology:  Dg Chest 2 View  11/23/2012  *RADIOLOGY REPORT*  Clinical Data: Cough and fever  CHEST - 2 VIEW  Comparison: Prior chest x-ray 03/27/2012  Findings: The lungs are well-aerated and free from pulmonary edema, focal airspace consolidation or pulmonary nodule.  Cardiac and mediastinal contours are within normal limits.  No pneumothorax, or pleural effusion. No acute osseous findings.  IMPRESSION:  No acute cardiopulmonary disease.   Original Report Authenticated By: Malachy Moan, M.D.    I reviewed the images independently and personally and concur with the radiologist's findings.  Other Labs Obtained at Urgent Care Center:  An influenza PCR panel was obtained.  Results are pending at this time and we will call about any positive results.  Assessment:  The encounter diagnosis was Influenza-like illness.  Plan:   1.  The following meds were prescribed:   New Prescriptions   CHLORPHENIRAMINE-HYDROCODONE (TUSSIONEX) 10-8 MG/5ML LQCR    Take 5 mLs by mouth every 12 (twelve) hours as needed.   OSELTAMIVIR (TAMIFLU) 75 MG CAPSULE    Take 1 capsule (75 mg total) by mouth every 12 (twelve) hours.   2.  The patient was instructed in symptomatic care and handouts were given. 3.  The patient was told to return if becoming worse in any way, if  no better in 3 or 4 days, and given some red flag symptoms that would indicate earlier return.   Reuben Likes, MD 11/23/12 [phone]

## 2012-11-24 LAB — INFLUENZA PANEL BY PCR (TYPE A & B)
H1N1 flu by pcr: DETECTED — AB
Influenza A By PCR: POSITIVE — AB

## 2012-11-26 NOTE — ED Notes (Signed)
Patient called to check on lab report of flu swab done on 1230; report positive, and patient advised

## 2012-11-26 NOTE — ED Notes (Signed)
Work note requested by pt; discussed w Dr Lorenz Coaster, note to return to work 1-6; note plcaed at front desk; pt advised

## 2013-01-21 ENCOUNTER — Other Ambulatory Visit (HOSPITAL_COMMUNITY)
Admission: RE | Admit: 2013-01-21 | Discharge: 2013-01-21 | Disposition: A | Discharge status: Home / Self Care | Payer: 59 | Source: Ambulatory Visit | Attending: Family Medicine | Admitting: Family Medicine

## 2013-01-21 ENCOUNTER — Encounter: Payer: Self-pay | Admitting: Family Medicine

## 2013-01-21 ENCOUNTER — Ambulatory Visit (INDEPENDENT_AMBULATORY_CARE_PROVIDER_SITE_OTHER): Payer: 59 | Admitting: Family Medicine

## 2013-01-21 VITALS — BP 118/78 | HR 71 | Resp 16 | Wt 202.4 lb

## 2013-01-21 DIAGNOSIS — Z01419 Encounter for gynecological examination (general) (routine) without abnormal findings: Secondary | ICD-10-CM | POA: Insufficient documentation

## 2013-01-21 DIAGNOSIS — Z113 Encounter for screening for infections with a predominantly sexual mode of transmission: Secondary | ICD-10-CM | POA: Insufficient documentation

## 2013-01-21 DIAGNOSIS — K219 Gastro-esophageal reflux disease without esophagitis: Secondary | ICD-10-CM

## 2013-01-21 DIAGNOSIS — Z124 Encounter for screening for malignant neoplasm of cervix: Secondary | ICD-10-CM

## 2013-01-21 DIAGNOSIS — N760 Acute vaginitis: Secondary | ICD-10-CM | POA: Insufficient documentation

## 2013-01-21 DIAGNOSIS — R5383 Other fatigue: Secondary | ICD-10-CM

## 2013-01-21 DIAGNOSIS — R5381 Other malaise: Secondary | ICD-10-CM

## 2013-01-21 DIAGNOSIS — E049 Nontoxic goiter, unspecified: Secondary | ICD-10-CM

## 2013-01-21 DIAGNOSIS — Z0000 Encounter for general adult medical examination without abnormal findings: Secondary | ICD-10-CM | POA: Insufficient documentation

## 2013-01-21 DIAGNOSIS — Z1151 Encounter for screening for human papillomavirus (HPV): Secondary | ICD-10-CM | POA: Insufficient documentation

## 2013-01-21 DIAGNOSIS — E669 Obesity, unspecified: Secondary | ICD-10-CM

## 2013-01-21 MED ORDER — PANTOPRAZOLE SODIUM 40 MG PO TBEC: 40.0000 mg | DELAYED_RELEASE_TABLET | Freq: Every day | ORAL | Status: DC

## 2013-01-21 NOTE — Patient Instructions (Addendum)
F/U in 14 weeks, call if you need me before please  Log into labs  On my chart  You are referred to Dr Betsey Amen and treat your gERd with difficulty swallowing  Protonix 40mg  daily is sent to [facility] for GERd  Follow a 1200 to 15000 cal diet and commit to daily exercise , minimum 45 minutes.  Weight loss goal is 12 to 15 pounds  CBC. HBA1C, vit D, lipid and chem7 and TSH today

## 2013-01-21 NOTE — Progress Notes (Signed)
  Subjective:    Patient ID: Janet Gonzalez, female    DOB: 1981-07-18, 32 y.o.   MRN: [id number]  HPI The PT is here for follow annual exam   There are no new concerns.  C/o uncontrolled reflux symptoms, worse in the past approx 2 months, and states that solids seem to stick in her throat when she is swallowing also She is concerned about her weight gain and is working to change this with lifestyle       Review of Systems See HPI Denies recent fever or chills. Denies sinus pressure, nasal congestion, ear pain or sore throat. Denies chest congestion, productive cough or wheezing. Denies chest pains, palpitations and leg swelling Denies abdominal pain, nausea, vomiting,diarrhea or constipation.   Denies dysuria, frequency, hesitancy or incontinence. Denies joint pain, swelling and limitation in mobility. Denies headaches, seizures, numbness, or tingling. Denies depression, anxiety or insomnia. Denies skin break down or rash.        Objective:   Physical Exam  Pleasant well nourished female, alert and oriented x 3, in no cardio-pulmonary distress. Afebrile. HEENT No facial trauma or asymetry. Sinuses non tender.  EOMI, PERTL,  External ears normal, tympanic membranes clear. Oropharynx moist, no exudate, good dentition. Neck: supple, no adenopathy,JVD or thyromegaly.No bruits.  Chest: Clear to ascultation bilaterally.No crackles or wheezes. Non tender to palpation  Breast: No asymetry,no masses. No nipple discharge or inversion. No axillary or supraclavicular adenopathy  Cardiovascular system; Heart sounds normal,  S1 and  S2 ,no S3.  No murmur, or thrill. Apical beat not displaced Peripheral pulses normal.  Abdomen: Soft, non tender, no organomegaly or masses. No bruits. Bowel sounds normal. No guarding, tenderness or rebound.  GU: External genitalia normal. No lesions.Normal female distribution of hair Vaginal canal normal.physiologic  discharge. Uterus  normal size, no adnexal masses, no cervical motion or adnexal tenderness.  Musculoskeletal exam: Full ROM of spine, hips , shoulders and knees. No deformity ,swelling or crepitus noted. No muscle wasting or atrophy.   Neurologic: Cranial nerves 2 to 12 intact. Power, tone ,sensation and reflexes normal throughout. No disturbance in gait. No tremor.  Skin: Intact, no ulceration, erythema , scaling or rash noted. Pigmentation normal throughout  Psych; Normal mood and affect. Judgement and concentration normal       Assessment & Plan:

## 2013-01-24 NOTE — Assessment & Plan Note (Signed)
Uncontrolled symptoms with dysphagia. Pt referred to GI and she is also to start protonix Weight loss and reduced caffeine intake as well as no eating 3 hrs before bedtime is discussed

## 2013-01-24 NOTE — Assessment & Plan Note (Signed)
Deteriorated. Patient re-educated about  the importance of commitment to a  minimum of 150 minutes of exercise per week. The importance of healthy food choices with portion control discussed. Encouraged to start a food diary, count calories and to consider  joining a support group. Sample diet sheets offered. Goals set by the patient for the next several months.    

## 2013-01-24 NOTE — Assessment & Plan Note (Signed)
Annual exam completed as documented. Pap sent Healhty lifestyle discussed and encouraged, and goals set

## 2013-02-05 LAB — CBC
MCHC: 33.1 g/dL (ref 30.0–36.0)
MCV: 87.8 fL (ref 78.0–100.0)
Platelets: 264 10*3/uL (ref 150–400)
RDW: 14.8 % (ref 11.5–15.5)
WBC: 4.9 10*3/uL (ref 4.0–10.5)

## 2013-02-05 LAB — TSH: TSH: 2.77 u[IU]/mL (ref 0.350–4.500)

## 2013-02-05 LAB — LIPID PANEL
Cholesterol: 147 mg/dL (ref 0–200)
HDL: 56 mg/dL (ref >39)
Total CHOL/HDL Ratio: 2.6 Ratio
Triglycerides: 52 mg/dL (ref <150)
VLDL: 10 mg/dL (ref 0–40)

## 2013-02-05 LAB — BASIC METABOLIC PANEL
BUN: 15 mg/dL (ref 6–23)
Calcium: 8.9 mg/dL (ref 8.4–10.5)
Creat: 0.8 mg/dL (ref 0.50–1.10)
Potassium: 4.1 mEq/L (ref 3.5–5.3)

## 2013-02-06 LAB — VITAMIN D 25 HYDROXY (VIT D DEFICIENCY, FRACTURES): Vit D, 25-Hydroxy: 17 ng/mL — ABNORMAL LOW (ref 30–89)

## 2013-02-07 ENCOUNTER — Other Ambulatory Visit: Payer: Self-pay | Admitting: Family Medicine

## 2013-02-10 MED ORDER — ERGOCALCIFEROL 1.25 MG (50000 UT) PO CAPS: 50000.0000 [IU] | ORAL_CAPSULE | ORAL | Status: DC

## 2013-02-17 ENCOUNTER — Telehealth: Payer: Self-pay | Admitting: Family Medicine

## 2013-02-17 NOTE — Telephone Encounter (Signed)
Janet Gonzalez spoke with patient

## 2013-04-07 ENCOUNTER — Telehealth: Payer: Self-pay | Admitting: Family Medicine

## 2013-04-07 NOTE — Telephone Encounter (Signed)
Called and left msg.  Left is msg that no record of TDAP found given in this office.

## 2013-04-20 ENCOUNTER — Ambulatory Visit: Payer: 59 | Admitting: Family Medicine

## 2013-07-01 ENCOUNTER — Ambulatory Visit (INDEPENDENT_AMBULATORY_CARE_PROVIDER_SITE_OTHER): Payer: 59 | Admitting: Family Medicine

## 2013-07-01 ENCOUNTER — Encounter: Payer: Self-pay | Admitting: Family Medicine

## 2013-07-01 VITALS — BP 118/80 | HR 94 | Resp 16 | Ht 65.0 in | Wt 206.0 lb

## 2013-07-01 DIAGNOSIS — E669 Obesity, unspecified: Secondary | ICD-10-CM

## 2013-07-01 DIAGNOSIS — M25569 Pain in unspecified knee: Secondary | ICD-10-CM

## 2013-07-01 DIAGNOSIS — E559 Vitamin D deficiency, unspecified: Secondary | ICD-10-CM | POA: Insufficient documentation

## 2013-07-01 DIAGNOSIS — M25562 Pain in left knee: Secondary | ICD-10-CM | POA: Insufficient documentation

## 2013-07-01 DIAGNOSIS — N920 Excessive and frequent menstruation with regular cycle: Secondary | ICD-10-CM

## 2013-07-01 MED ORDER — ERGOCALCIFEROL 1.25 MG (50000 UT) PO CAPS: 50000.0000 [IU] | ORAL_CAPSULE | ORAL | Status: DC

## 2013-07-01 MED ORDER — PHENTERMINE HCL 37.5 MG PO TABS: 37.5000 mg | ORAL_TABLET | Freq: Every day | ORAL | Status: DC

## 2013-07-01 MED ORDER — IBUPROFEN 800 MG PO TABS: ORAL_TABLET | ORAL | Status: DC

## 2013-07-01 NOTE — Patient Instructions (Addendum)
F/u end November, call if you need me before   New is phentermine half daily  You are referred to orthopeadics re left knee  Weight loss goal of 3 pounds per month  You need to take vit D every week  Vit D before next visit

## 2013-07-01 NOTE — Progress Notes (Signed)
  Subjective:    Patient ID: Janet Gonzalez, female    DOB: 03-20-1981, 32 y.o.   MRN: [id number]  HPI 1 month ago, pt felt left knee pop after sitting [place] style, since then she has had pain in  the knee lateral aspect, with supra patellar crunching Concerned al;so about weight , no loss despite exercise, requests help with appetite suppresssant, which has helped in the apst. Tolerated wioh no adverse s/e   Review of Systems    See HPI Denies recent fever or chills. Denies sinus pressure, nasal congestion, ear pain or sore throat. Denies chest congestion, productive cough or wheezing. Denies chest pains, palpitations and leg swelling Denies abdominal pain, nausea, vomiting,diarrhea or constipation.   Denies dysuria, frequency, hesitancy or incontinence.  Denies headaches, seizures, numbness, or tingling. Denies depression, anxiety or insomnia. Denies skin break down or rash.     Objective:   Physical Exam  Patient alert and oriented and in no cardiopulmonary distress.  HEENT: No facial asymmetry, EOMI, no sinus tenderness,  oropharynx pink and moist.  Neck supple no adenopathy.  Chest: Clear to auscultation bilaterally.  CVS: S1, S2 no murmurs, no S3.  ABD: Soft non tender. Bowel sounds normal.  Ext: No edema  MS: Adequate ROM spine, shoulders, hips and reduced in left  Knee, tender on lateral aspect with crepitus  Skin: Intact, no ulcerations or rash noted.  Psych: Good eye contact, normal affect. Memory intact not anxious or depressed appearing.  CNS: CN 2-12 intact, power, tone and sensation normal throughout.       Assessment & Plan:

## 2013-07-04 NOTE — Assessment & Plan Note (Signed)
ibupriogfenm peri menstrually for bleeding and pain, no interest in OCP

## 2013-07-04 NOTE — Assessment & Plan Note (Signed)
Pt to take weekly supplement for 6 month

## 2013-07-04 NOTE — Assessment & Plan Note (Signed)
Unchanged. Patient re-educated about  the importance of commitment to a  minimum of 150 minutes of exercise per week. The importance of healthy food choices with portion control discussed. Encouraged to start a food diary, count calories and to consider  joining a support group. Sample diet sheets offered. Goals set by the patient for the next several months.    

## 2013-07-04 NOTE — Assessment & Plan Note (Signed)
1 month history with limitation in mobility and crepitus, ortho to eval and manage

## 2013-09-30 ENCOUNTER — Other Ambulatory Visit: Payer: Self-pay

## 2013-10-04 ENCOUNTER — Encounter: Payer: Self-pay | Admitting: Family Medicine

## 2013-10-04 ENCOUNTER — Ambulatory Visit (INDEPENDENT_AMBULATORY_CARE_PROVIDER_SITE_OTHER): Payer: 59 | Admitting: Family Medicine

## 2013-10-04 VITALS — BP 120/78 | HR 84 | Temp 99.0°F | Resp 16 | Ht 65.0 in | Wt 202.1 lb

## 2013-10-04 DIAGNOSIS — R5381 Other malaise: Secondary | ICD-10-CM

## 2013-10-04 DIAGNOSIS — A499 Bacterial infection, unspecified: Secondary | ICD-10-CM

## 2013-10-04 DIAGNOSIS — B9689 Other specified bacterial agents as the cause of diseases classified elsewhere: Secondary | ICD-10-CM | POA: Insufficient documentation

## 2013-10-04 DIAGNOSIS — Z139 Encounter for screening, unspecified: Secondary | ICD-10-CM

## 2013-10-04 DIAGNOSIS — E669 Obesity, unspecified: Secondary | ICD-10-CM

## 2013-10-04 DIAGNOSIS — J019 Acute sinusitis, unspecified: Secondary | ICD-10-CM

## 2013-10-04 DIAGNOSIS — J209 Acute bronchitis, unspecified: Secondary | ICD-10-CM

## 2013-10-04 DIAGNOSIS — E559 Vitamin D deficiency, unspecified: Secondary | ICD-10-CM

## 2013-10-04 MED ORDER — FLUCONAZOLE 150 MG PO TABS: ORAL_TABLET | ORAL | Status: DC

## 2013-10-04 MED ORDER — LEVOFLOXACIN 750 MG PO TABS: 750.0000 mg | ORAL_TABLET | Freq: Every day | ORAL | Status: AC

## 2013-10-04 MED ORDER — PROMETHAZINE-DM 6.25-15 MG/5ML PO SYRP: ORAL_SOLUTION | ORAL | Status: AC

## 2013-10-04 MED ORDER — BENZONATATE 100 MG PO CAPS: 100.0000 mg | ORAL_CAPSULE | Freq: Three times a day (TID) | ORAL | Status: AC

## 2013-10-04 NOTE — Patient Instructions (Addendum)
CPE and pap end March, call if you need me before  You are being treated for acute bacterial sinusitis and acute bronchitis. Medication has been sent in  Weight loss goal of 2.5 opounds per month  Sugar is poison espescialy for people who are overweight, only natural sugar is healthy  It is important that you exercise regularly at least 30 minutes 5 times a week. If you develop chest pain, have severe difficulty breathing, or feel very tired, stop exercising immediately and seek medical attention    CBC, fasting lipid, cmp, HBA1C, TSH, Vit D  In March, before visit

## 2013-10-04 NOTE — Progress Notes (Signed)
  Subjective:    Patient ID: Janet Gonzalez, female    DOB: [birth year], 32 y.o.   MRN: [id number]  HPI 1 to 2 week h/o head and chest congestion progressive symptoms with fever and chills, also generalized ,malaise . Positive sick contact at home Conncerned about inconsistent effort at healthy lifestyle change with no weight loss, intends to change this   Review of Systems See HPI  Denies chest pains, palpitations and leg swelling Denies abdominal pain, nausea, vomiting,diarrhea or constipation.   Denies dysuria, frequency, hesitancy or incontinence. Denies uncontrolled joint pain, swelling and limitation in mobility. Denies headaches, seizures, numbness, or tingling. Denies depression, anxiety or insomnia. Denies skin break down or rash.        Objective:   Physical Exam  Patient alert and oriented and in no cardiopulmonary distress.  HEENT: No facial asymmetry, EOMI, frontal ad maxillary  sinus tenderness,  oropharynx pink and moist.  Neck supple bilateral anterior cervical adenopathy.TM clear bilaterally  Chest: Adequate though reduced air entry bilaterally , scattered crackles, no wheezes  CVS: S1, S2 no murmurs, no S3.  ABD: Soft non tender. Bowel sounds normal.  Ext: No edema  MS: Adequate ROM spine, shoulders, hips and knees.  Skin: Intact, no ulcerations or rash noted.  Psych: Good eye contact, normal affect. Memory intact not anxious or depressed appearing.  CNS: CN 2-12 intact, power, tone and sensation normal throughout.       Assessment & Plan:

## 2013-10-17 NOTE — Assessment & Plan Note (Signed)
Antibiotic and decongestant prescribed 

## 2013-10-17 NOTE — Assessment & Plan Note (Signed)
Needs at least 6 month of treatment

## 2013-10-17 NOTE — Assessment & Plan Note (Signed)
Antibiotic and decongestant prescribed, along with cough suppressant

## 2013-10-17 NOTE — Assessment & Plan Note (Signed)
Unchnaged. Patient re-educated about  the importance of commitment to a  minimum of 150 minutes of exercise per week. The importance of healthy food choices with portion control discussed. Encouraged to start a food diary, count calories and to consider  joining a support group. Sample diet sheets offered. Goals set by the patient for the next several months.    

## 2013-10-18 ENCOUNTER — Ambulatory Visit: Payer: 59 | Admitting: Family Medicine

## 2013-10-19 ENCOUNTER — Telehealth: Payer: Self-pay | Admitting: Family Medicine

## 2013-10-20 ENCOUNTER — Other Ambulatory Visit: Payer: Self-pay | Admitting: Family Medicine

## 2013-10-20 ENCOUNTER — Other Ambulatory Visit: Payer: Self-pay

## 2013-10-20 DIAGNOSIS — B9689 Other specified bacterial agents as the cause of diseases classified elsewhere: Secondary | ICD-10-CM

## 2013-10-20 MED ORDER — FLUCONAZOLE 150 MG PO TABS: ORAL_TABLET | ORAL | Status: AC

## 2013-10-20 MED ORDER — CLINDAMYCIN HCL 300 MG PO CAPS: 300.0000 mg | ORAL_CAPSULE | Freq: Three times a day (TID) | ORAL | Status: DC

## 2013-10-20 NOTE — Telephone Encounter (Signed)
You may need in 2 fluconazole , pls do if she needs

## 2013-10-20 NOTE — Telephone Encounter (Signed)
Cleocin is sent in for 1 week, please let her know

## 2013-10-20 NOTE — Telephone Encounter (Signed)
Patient aware and meds sent to requested pharmacy.  

## 2013-10-20 NOTE — Telephone Encounter (Signed)
Patient states that symptoms never resolved.  She completed Levaquin as prescribed and was still having yellow/ green sinus drainage.  She states that now the sinus pain/pressure has returned.  Would like to know if more ABT can be sent or what should her next step be.  Please advise.

## 2013-11-04 ENCOUNTER — Other Ambulatory Visit: Payer: Self-pay | Admitting: Family Medicine

## 2013-11-04 DIAGNOSIS — E669 Obesity, unspecified: Secondary | ICD-10-CM

## 2013-11-04 MED ORDER — PHENTERMINE HCL 37.5 MG PO TABS: 37.5000 mg | ORAL_TABLET | Freq: Every day | ORAL | Status: DC

## 2014-02-08 ENCOUNTER — Emergency Department (HOSPITAL_COMMUNITY)
Admission: EM | Admit: 2014-02-08 | Discharge: 2014-02-08 | Disposition: A | Discharge status: Nursing Home (Includes ICF) | Payer: 59 | Source: Home / Self Care

## 2014-02-08 ENCOUNTER — Emergency Department (HOSPITAL_COMMUNITY): Admission: EM | Admit: 2014-02-08 | Discharge: 2014-02-08 | Discharge status: Against Medical Advice | Payer: 59

## 2014-02-08 ENCOUNTER — Encounter (HOSPITAL_COMMUNITY): Payer: Self-pay | Admitting: Emergency Medicine

## 2014-02-08 ENCOUNTER — Emergency Department (HOSPITAL_COMMUNITY)
Admission: EM | Admit: 2014-02-08 | Discharge: 2014-02-08 | Disposition: A | Discharge status: Home / Self Care | Payer: 59 | Attending: Emergency Medicine | Admitting: Emergency Medicine

## 2014-02-08 ENCOUNTER — Emergency Department (HOSPITAL_COMMUNITY): Payer: 59

## 2014-02-08 DIAGNOSIS — F411 Generalized anxiety disorder: Secondary | ICD-10-CM | POA: Insufficient documentation

## 2014-02-08 DIAGNOSIS — Z87891 Personal history of nicotine dependence: Secondary | ICD-10-CM | POA: Insufficient documentation

## 2014-02-08 DIAGNOSIS — R079 Chest pain, unspecified: Secondary | ICD-10-CM

## 2014-02-08 DIAGNOSIS — R002 Palpitations: Secondary | ICD-10-CM | POA: Insufficient documentation

## 2014-02-08 DIAGNOSIS — R0602 Shortness of breath: Secondary | ICD-10-CM | POA: Insufficient documentation

## 2014-02-08 DIAGNOSIS — Z881 Allergy status to other antibiotic agents status: Secondary | ICD-10-CM | POA: Insufficient documentation

## 2014-02-08 DIAGNOSIS — Z79899 Other long term (current) drug therapy: Secondary | ICD-10-CM | POA: Insufficient documentation

## 2014-02-08 DIAGNOSIS — R0989 Other specified symptoms and signs involving the circulatory and respiratory systems: Secondary | ICD-10-CM

## 2014-02-08 DIAGNOSIS — R0789 Other chest pain: Secondary | ICD-10-CM | POA: Insufficient documentation

## 2014-02-08 DIAGNOSIS — R0600 Dyspnea, unspecified: Secondary | ICD-10-CM

## 2014-02-08 DIAGNOSIS — R0609 Other forms of dyspnea: Secondary | ICD-10-CM

## 2014-02-08 HISTORY — DX: Anxiety disorder, unspecified: F41.9

## 2014-02-08 LAB — CBC
HEMATOCRIT: 40.6 % (ref 36.0–46.0)
HEMOGLOBIN: 14.1 g/dL (ref 12.0–15.0)
MCH: 30.4 pg (ref 26.0–34.0)
MCHC: 34.7 g/dL (ref 30.0–36.0)
MCV: 87.5 fL (ref 78.0–100.0)
Platelets: 279 10*3/uL (ref 150–400)
RBC: 4.64 MIL/uL (ref 3.87–5.11)
RDW: 13.5 % (ref 11.5–15.5)
WBC: 5 10*3/uL (ref 4.0–10.5)

## 2014-02-08 LAB — BASIC METABOLIC PANEL
BUN: 11 mg/dL (ref 6–23)
CO2: 26 meq/L (ref 19–32)
Calcium: 9.6 mg/dL (ref 8.4–10.5)
Chloride: 101 mEq/L (ref 96–112)
Creatinine, Ser: 0.72 mg/dL (ref 0.50–1.10)
GFR calc Af Amer: >90 mL/min (ref >90)
Glucose, Bld: 85 mg/dL (ref 70–99)
POTASSIUM: 4.1 meq/L (ref 3.7–5.3)
Sodium: 141 mEq/L (ref 137–147)

## 2014-02-08 LAB — I-STAT TROPONIN, ED: Troponin i, poc: 0 ng/mL (ref 0.00–0.08)

## 2014-02-08 LAB — POC URINE PREG, ED: Preg Test, Ur: NEGATIVE

## 2014-02-08 LAB — TROPONIN I

## 2014-02-08 LAB — D-DIMER, QUANTITATIVE: D-Dimer, Quant: 0.35 ug/mL-FEU (ref 0.00–0.48)

## 2014-02-08 MED ORDER — GI COCKTAIL ~~LOC~~
30.0000 mL | Freq: Once | ORAL | Status: AC
  Administered 2014-02-08: 30 mL via ORAL
  Filled 2014-02-08: qty 30

## 2014-02-08 NOTE — ED Provider Notes (Signed)
CSN: [id number]     Arrival date & time 02/08/14  1426 History   First MD Initiated Contact with Patient 02/08/14 1918     Chief Complaint  Patient presents with  . Shortness of Breath  . Palpitations  . Chest Pain     (Consider location/radiation/quality/duration/timing/severity/associated sxs/prior Treatment) HPI Comments: Patient presents from urgent care with ongoing palpitations, shortness of breath and chest pain have been 10 AM. 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 She describes it as a tightness. No diaphoresis, vomiting or nausea. She's had palpitations in the past but this is the first time she has had chest pain with them. Nothing makes the pain better or worse. She denies any fevers, cough, leg pain or leg swelling. Denies any cardiac history. Her mother had an MI at age 91. She is a former smoker. She is no longer on birth control. She has occasional episodes of sharp chest pain that lasts just a few seconds.    The history is provided by the patient.    Past Medical History  Diagnosis Date  . Anxiety    Past Surgical History  Procedure Laterality Date  . Thyroidectomy, partial    . Wisdom tooth extraction     Family History  Problem Relation Age of Onset  . Heart disease Mother   . Depression Mother   . COPD Maternal Grandmother    History  Substance Use Topics  . Smoking status: Former Research scientist (life sciences)  . Smokeless tobacco: Not on file  . Alcohol Use: Yes     Comment: occasionally   OB History   Grav Para Term Preterm Abortions TAB SAB Ect Mult Living                 Review of Systems  Constitutional: Negative for fever and activity change.  HENT: Negative for congestion and rhinorrhea.   Respiratory: Positive for chest tightness and shortness of breath.   Cardiovascular: Positive for chest pain and palpitations.  Gastrointestinal: Negative for nausea, vomiting and  abdominal pain.  Genitourinary: Negative for dysuria and hematuria.  Musculoskeletal: Negative for arthralgias and myalgias.  Neurological: Negative for dizziness, weakness and numbness.  A complete 10 system review of systems was obtained and all systems are negative except as noted in the HPI and PMH.      Allergies  Cephalexin  Home Medications   Current Outpatient Rx  Name  Route  Sig  Dispense  Refill  . ergocalciferol (VITAMIN D2) 50000 UNITS capsule   Oral   Take 50,000 Units by mouth once a week. One capsule once weekly. Thursday         . glucosamine-chondroitin 500-400 MG tablet   Oral   Take 1 tablet by mouth 2 (two) times daily.         Marland Kitchen ibuprofen (ADVIL,MOTRIN) 800 MG tablet      One 3 times daily for 2 days before onset of menses, and for the first 2 days of menses   60 tablet   2   . Linoleic Acid Conjugated (CLA PO)   Oral   Take 1 tablet by mouth daily.         . Multiple Vitamins-Minerals (MULTIVITAMIN WITH MINERALS) tablet   Oral   Take 1 tablet by mouth daily.         . Nutritional Supplements (BCAD 2 PO)   Oral   Take 1  tablet by mouth daily.         . pantoprazole (PROTONIX) 40 MG tablet   Oral   Take 1 tablet (40 mg total) by mouth daily.   90 tablet   0   . phentermine (ADIPEX-P) 37.5 MG tablet   Oral   Take 1 tablet (37.5 mg total) by mouth daily before breakfast.   30 tablet   0   . EXPIRED: etonogestrel-ethinyl estradiol (NUVARING) 0.12-0.015 MG/24HR vaginal ring   Vaginal   Place 1 each vaginally every 21 ( twenty-one) days. Insert one (1) ring vaginally and leave in place for three (3) weeks, then remove for one (1) week.   3 each   3   . EXPIRED: etonogestrel-ethinyl estradiol (NUVARING) 0.12-0.015 MG/24HR vaginal ring   Vaginal   Place 1 each vaginally every 21 ( twenty-one) days. Insert one (1) ring vaginally and leave in place for three (3) weeks, then remove for one (1) week.   1 each   11   . EXPIRED:  loratadine (CLARITIN) 10 MG tablet   Oral   Take 1 tablet (10 mg total) by mouth daily.   30 tablet   2   . EXPIRED: phentermine 37.5 MG capsule   Oral   Take 1 capsule (37.5 mg total) by mouth every morning.   30 capsule   2    BP 104/60  Pulse 70  Temp(Src) 97.2 F (36.2 C) (Oral)  Resp 18  Wt 190 lb (86.183 kg)  SpO2 100%  LMP 01/21/2014 Physical Exam  Constitutional: She is oriented to person, place, and time. She appears well-developed and well-nourished. No distress.  HENT:  Head: Normocephalic and atraumatic.  Mouth/Throat: Oropharynx is clear and moist. No oropharyngeal exudate.  Eyes: Conjunctivae and EOM are normal. Pupils are equal, round, and reactive to light.  Neck: Normal range of motion. Neck supple.  Cardiovascular: Normal rate, regular rhythm and normal heart sounds.   Pulmonary/Chest: Effort normal and breath sounds normal. No respiratory distress. She exhibits no tenderness.  Abdominal: Soft. There is no tenderness. There is no rebound and no guarding.  Musculoskeletal: Normal range of motion. She exhibits no edema and no tenderness.  Neurological: She is alert and oriented to person, place, and time. No cranial nerve deficit. She exhibits normal muscle tone. Coordination normal.  Skin: Skin is warm.    ED Course  Procedures (including critical care time) Labs Review Labs Reviewed  CBC  BASIC METABOLIC PANEL  TROPONIN I  D-DIMER, QUANTITATIVE  I-STAT TROPOININ, ED  POC URINE PREG, ED   Imaging Review Dg Chest 2 View  02/08/2014   CLINICAL DATA:  Chest pain and shortness of breath for 1 day  EXAM: CHEST  2 VIEW  COMPARISON:  DG CHEST 2 VIEW dated 11/23/2012  FINDINGS: The heart size and mediastinal contours are within normal limits. Both lungs are clear. The visualized skeletal structures are unremarkable.  IMPRESSION: No active cardiopulmonary disease.   Electronically Signed   By: Skipper Cliche M.D.   On: 02/08/2014 20:16     EKG  Interpretation   Date/Time:  Tuesday February 08 2014 21:47:43 EDT Ventricular Rate:  68 PR Interval:  162 QRS Duration: 77 QT Interval:  385 QTC Calculation: 409 R Axis:   75 Text Interpretation:  Sinus rhythm Multiple premature complexes, vent  No  significant change was found Confirmed by Wyvonnia Dusky  MD, Shrihan Putt [phone]) on  02/08/2014 9:56:11 PM      MDM   Final diagnoses:  Atypical chest pain   Chest pain, palpitations, shortness of breath as been ongoing since 10 AM. EKG with normal sinus rhythm.  Chest x-ray is negative. Troponin is negative. Pain is not reproducible to palpation. D-dimer is negative.  Pain has been ongoing since 10 AM and is still constant arrival in the ED to 9 hours later. Pain is atypical for ACS. Would expect troponin elevation by now.   Troponin negative x2. D-dimer negative. Patient will followup as an outpatient with her PCP. She is instructed to take her PPI as prescribed. Pain is atypical for ACS, PE or dissection. No evidence of pneumothorax or pneumonia. Return precautions discussed.  Ezequiel Essex, MD 02/08/14 2351

## 2014-02-08 NOTE — Discharge Instructions (Signed)
Chest Pain (Nonspecific) There is no evidence of heart attack or blood clot in the lung. Followup with your doctor in the cardiologist. Take the Nexium as prescribed. Return to the ED if you develop new or worsening symptoms. It is often hard to give a specific diagnosis for the cause of chest pain. There is always a chance that your pain could be related to something serious, such as a heart attack or a blood clot in the lungs. You need to follow up with your caregiver for further evaluation. CAUSES   Heartburn.  Pneumonia or bronchitis.  Anxiety or stress.  Inflammation around your heart (pericarditis) or lung (pleuritis or pleurisy).  A blood clot in the lung.  A collapsed lung (pneumothorax). It can develop suddenly on its own (spontaneous pneumothorax) or from injury (trauma) to the chest.  Shingles infection (herpes zoster virus). The chest wall is composed of bones, muscles, and cartilage. Any of these can be the source of the pain.  The bones can be bruised by injury.  The muscles or cartilage can be strained by coughing or overwork.  The cartilage can be affected by inflammation and become sore (costochondritis). DIAGNOSIS  Lab tests or other studies, such as X-rays, electrocardiography, stress testing, or cardiac imaging, may be needed to find the cause of your pain.  TREATMENT   Treatment depends on what may be causing your chest pain. Treatment may include:  Acid blockers for heartburn.  Anti-inflammatory medicine.  Pain medicine for inflammatory conditions.  Antibiotics if an infection is present.  You may be advised to change lifestyle habits. This includes stopping smoking and avoiding alcohol, caffeine, and chocolate.  You may be advised to keep your head raised (elevated) when sleeping. This reduces the chance of acid going backward from your stomach into your esophagus.  Most of the time, nonspecific chest pain will improve within 2 to 3 days with rest and  mild pain medicine. HOME CARE INSTRUCTIONS   If antibiotics were prescribed, take your antibiotics as directed. Finish them even if you start to feel better.  For the next few days, avoid physical activities that bring on chest pain. Continue physical activities as directed.  Do not smoke.  Avoid drinking alcohol.  Only take over-the-counter or prescription medicine for pain, discomfort, or fever as directed by your caregiver.  Follow your caregiver's suggestions for further testing if your chest pain does not go away.  Keep any follow-up appointments you made. If you do not go to an appointment, you could develop lasting (chronic) problems with pain. If there is any problem keeping an appointment, you must call to reschedule. SEEK MEDICAL CARE IF:   You think you are having problems from the medicine you are taking. Read your medicine instructions carefully.  Your chest pain does not go away, even after treatment.  You develop a rash with blisters on your chest. SEEK IMMEDIATE MEDICAL CARE IF:   You have increased chest pain or pain that spreads to your arm, neck, jaw, back, or abdomen.  You develop shortness of breath, an increasing cough, or you are coughing up blood.  You have severe back or abdominal pain, feel nauseous, or vomit.  You develop severe weakness, fainting, or chills.  You have a fever. THIS IS AN EMERGENCY. Do not wait to see if the pain will go away. Get medical help at once. Call your local emergency services (911 in [place] Do not drive yourself to the hospital. MAKE SURE YOU:   Understand  these instructions.  Will watch your condition.  Will get help right away if you are not doing well or get worse. Document Released: 08/21/2005 Document Revised: 02/03/2012 Document Reviewed: 06/16/2008 [facility] Patient Information 2014 [place].

## 2014-02-08 NOTE — ED Notes (Signed)
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  Sent from urgent care for further, mother has history of mi before age of 49

## 2014-02-08 NOTE — ED Notes (Signed)
Pt transported to xray via stretcher

## 2014-02-08 NOTE — ED Provider Notes (Signed)
CSN: [id number]     Arrival date & time 02/08/14  1240 History   First MD Initiated Contact with Patient 02/08/14 1300     Chief Complaint  Patient presents with  . Chest Pain   (Consider location/radiation/quality/duration/timing/severity/associated sxs/prior Treatment) HPI Comments: 33 year old female presents with complaints of persistent palpitations, shortness of breath and needed and left anterior chest pain. This occurred at 9:50 AM at work this morning. She describes the palpitations as a heavy floppy feeling. She describes the chest pain as a tight headache. This was not associated with diaphoresis or vomiting although she did have a brief episode of nausea. She has had palpitations and shortness of breath in the past in the Holter monitoring and echo in 2005 there was no etiology or pathology found at that time. The difference in this episode was the prolongation of the palpitations for which she continues to have as well as the persistent chest pain. She is also concerned because she has a family history of heart disease. Her mother has had 3 documented MI's. The pt takes phentermine on a prn basis with her last dose 48 h ago. She endorses daily caffeinated beverages to include soft drinks and coffee to include this AM.    History reviewed. No pertinent past medical history. Past Surgical History  Procedure Laterality Date  . Thyroidectomy, partial    . Wisdom tooth extraction     Family History  Problem Relation Age of Onset  . Heart disease Mother   . Depression Mother   . COPD Maternal Grandmother    History  Substance Use Topics  . Smoking status: Former Research scientist (life sciences)  . Smokeless tobacco: Not on file  . Alcohol Use: Yes     Comment: occasionally   OB History   Grav Para Term Preterm Abortions TAB SAB Ect Mult Living                 Review of Systems  Constitutional: Positive for activity change. Negative for fever and diaphoresis.  HENT: Negative.   Eyes: Negative for  visual disturbance.  Respiratory: Positive for chest tightness and shortness of breath. Negative for cough and wheezing.   Cardiovascular: Positive for chest pain and palpitations. Negative for leg swelling.  Gastrointestinal: Negative for vomiting, abdominal pain, diarrhea and constipation.  Genitourinary: Negative.   Musculoskeletal: Negative.   Skin: Negative.   Neurological: Negative.   Psychiatric/Behavioral: Negative.     Allergies  Cephalexin  Home Medications   Current Outpatient Rx  Name  Route  Sig  Dispense  Refill  . clindamycin (CLEOCIN) 300 MG capsule   Oral   Take 1 capsule (300 mg total) by mouth 3 (three) times daily.   21 capsule   0   . ergocalciferol (VITAMIN D2) 50000 UNITS capsule   Oral   Take 1 capsule (50,000 Units total) by mouth once a week. One capsule once weekly   12 capsule   1   . EXPIRED: etonogestrel-ethinyl estradiol (NUVARING) 0.12-0.015 MG/24HR vaginal ring   Vaginal   Place 1 each vaginally every 21 ( twenty-one) days. Insert one (1) ring vaginally and leave in place for three (3) weeks, then remove for one (1) week.   3 each   3   . EXPIRED: etonogestrel-ethinyl estradiol (NUVARING) 0.12-0.015 MG/24HR vaginal ring   Vaginal   Place 1 each vaginally every 21 ( twenty-one) days. Insert one (1) ring vaginally and leave in place for three (3) weeks, then remove for one (1) week.  1 each   11   . ibuprofen (ADVIL,MOTRIN) 800 MG tablet      One 3 times daily for 2 days before onset of menses, and for the first 2 days of menses   60 tablet   2   . EXPIRED: loratadine (CLARITIN) 10 MG tablet   Oral   Take 1 tablet (10 mg total) by mouth daily.   30 tablet   2   . pantoprazole (PROTONIX) 40 MG tablet   Oral   Take 1 tablet (40 mg total) by mouth daily.   90 tablet   0   . phentermine (ADIPEX-P) 37.5 MG tablet   Oral   Take 1 tablet (37.5 mg total) by mouth daily before breakfast.   30 tablet   0   . EXPIRED: phentermine  37.5 MG capsule   Oral   Take 1 capsule (37.5 mg total) by mouth every morning.   30 capsule   2    BP 139/84  Pulse 77  Temp(Src) 98.1 F (36.7 C) (Oral)  Resp 16  SpO2 100% Physical Exam  Nursing note and vitals reviewed. Constitutional: She is oriented to person, place, and time. She appears well-developed and well-nourished. No distress.  Eyes: Conjunctivae and EOM are normal. Pupils are equal, round, and reactive to light.  Neck: Normal range of motion. Neck supple.  Cardiovascular: Normal rate and normal heart sounds.   No murmur heard. Apical: Premature heart beats with pulses, although not on EKG.  Pulmonary/Chest: Effort normal and breath sounds normal. No respiratory distress. She has no wheezes. She has no rales. She exhibits no tenderness.  Unable to reproduce the chest pain.   Musculoskeletal: She exhibits no edema and no tenderness.  Lymphadenopathy:    She has no cervical adenopathy.  Neurological: She is alert and oriented to person, place, and time. She exhibits normal muscle tone.  Skin: Skin is warm and dry.  Psychiatric: She has a normal mood and affect.    ED Course  Procedures (including critical care time) Labs Review Labs Reviewed - No data to display Imaging Review No results found. EKG: NSR, no ectopy. No ST-T changes.   MDM   1. Chest pain   2. Palpitations   3. Dyspnea    Pt is stable with continued chest discomfort, palpitations, some anxiety.  Transfer to [facility] ED for chest pain evaluation.    Janne Napoleon, NP 02/08/14 [place], NP 02/08/14 [phone]

## 2014-02-08 NOTE — ED Notes (Signed)
Patient reports feeling fine this am, then started feeling bad.  Initially felt sob and then dizzy.  This was followed by palpitations and left chest pain.  Reports waves of nausea, no vomiting.  Patient reports history of anxiety

## 2014-02-08 NOTE — ED Provider Notes (Signed)
Medical screening examination/treatment/procedure(s) were performed by resident physician or non-physician practitioner and as supervising physician I was immediately available for consultation/collaboration.   Nekita Pita DOUGLAS MD.   Doylene Splinter D Lennette Fader, MD 02/08/14 1552 

## 2014-02-14 ENCOUNTER — Ambulatory Visit: Payer: 59 | Admitting: Family Medicine

## 2014-02-21 ENCOUNTER — Encounter: Payer: Self-pay | Admitting: Family Medicine

## 2014-02-21 ENCOUNTER — Other Ambulatory Visit (HOSPITAL_COMMUNITY)
Admission: RE | Admit: 2014-02-21 | Discharge: 2014-02-21 | Disposition: A | Discharge status: Home / Self Care | Payer: 59 | Source: Ambulatory Visit | Attending: Family Medicine | Admitting: Family Medicine

## 2014-02-21 ENCOUNTER — Ambulatory Visit (INDEPENDENT_AMBULATORY_CARE_PROVIDER_SITE_OTHER): Payer: 59 | Admitting: Family Medicine

## 2014-02-21 VITALS — BP 104/70 | HR 79 | Resp 16 | Wt 194.8 lb

## 2014-02-21 DIAGNOSIS — R87610 Atypical squamous cells of undetermined significance on cytologic smear of cervix (ASC-US): Secondary | ICD-10-CM

## 2014-02-21 DIAGNOSIS — Z0000 Encounter for general adult medical examination without abnormal findings: Secondary | ICD-10-CM

## 2014-02-21 DIAGNOSIS — A499 Bacterial infection, unspecified: Secondary | ICD-10-CM

## 2014-02-21 DIAGNOSIS — Z124 Encounter for screening for malignant neoplasm of cervix: Secondary | ICD-10-CM | POA: Insufficient documentation

## 2014-02-21 DIAGNOSIS — M940 Chondrocostal junction syndrome [Tietze]: Secondary | ICD-10-CM

## 2014-02-21 DIAGNOSIS — R87810 Cervical high risk human papillomavirus (HPV) DNA test positive: Secondary | ICD-10-CM | POA: Insufficient documentation

## 2014-02-21 DIAGNOSIS — R079 Chest pain, unspecified: Secondary | ICD-10-CM

## 2014-02-21 DIAGNOSIS — Z23 Encounter for immunization: Secondary | ICD-10-CM

## 2014-02-21 DIAGNOSIS — K219 Gastro-esophageal reflux disease without esophagitis: Secondary | ICD-10-CM

## 2014-02-21 DIAGNOSIS — R6889 Other general symptoms and signs: Secondary | ICD-10-CM

## 2014-02-21 DIAGNOSIS — B351 Tinea unguium: Secondary | ICD-10-CM

## 2014-02-21 DIAGNOSIS — N920 Excessive and frequent menstruation with regular cycle: Secondary | ICD-10-CM

## 2014-02-21 DIAGNOSIS — IMO0002 Reserved for concepts with insufficient information to code with codable children: Secondary | ICD-10-CM

## 2014-02-21 DIAGNOSIS — B9689 Other specified bacterial agents as the cause of diseases classified elsewhere: Secondary | ICD-10-CM

## 2014-02-21 DIAGNOSIS — J209 Acute bronchitis, unspecified: Secondary | ICD-10-CM

## 2014-02-21 DIAGNOSIS — Z1151 Encounter for screening for human papillomavirus (HPV): Secondary | ICD-10-CM | POA: Insufficient documentation

## 2014-02-21 DIAGNOSIS — J208 Acute bronchitis due to other specified organisms: Secondary | ICD-10-CM

## 2014-02-21 MED ORDER — BENZONATATE 100 MG PO CAPS
100.0000 mg | ORAL_CAPSULE | Freq: Two times a day (BID) | ORAL | Status: DC | PRN
Start: 2014-02-21 — End: 2014-06-21

## 2014-02-21 MED ORDER — LEVOFLOXACIN 500 MG PO TABS: 500.0000 mg | ORAL_TABLET | Freq: Every day | ORAL | Status: DC

## 2014-02-21 MED ORDER — FLUCONAZOLE 150 MG PO TABS: ORAL_TABLET | ORAL | Status: DC

## 2014-02-21 NOTE — Progress Notes (Signed)
Subjective:    Patient ID: Janet Gonzalez, female    DOB: 1981-10-09, 33 y.o.   MRN: [id number]  HPI Pt in for annual exam.  Need for immunization reviewed and updateed 1 week h/o increased post nasal drainage with green drainage, no fever or chills, excessive cough and sputum [proction which is thick and green . Anterior nasal drainage. No ear pain or sore throat Has experienced irregularity in heart rate with chest pain , light headedness, and  Nausea, seen in the ED, no ACS, Mom had MI in her early 24's, needs further evaluation by cardiology.  C/o localized chest pain with pressure x 1 day, non radiating  C/o thickened toenails x months wants this treated  C/o excessively heavy and painful menses, progressively worsening in the past 4 month, needs gyne help  C/o increased heatrburn  Esp when lying down, excessive stress on the job and financially, somewhat overwhelmed, but not interested in therapy through employee health, not suicidal or homicdal   Review of Systems See HPI` Specific compliants/concerns as above       Objective:   Physical Exam  BP 104/70  Pulse 79  Resp 16  Wt 194 lb 12.8 oz (88.361 kg)  SpO2 100%  LMP 02/14/2014 Pleasant obese female, alert and oriented x 3, in no cardio-pulmonary distress. Afebrile. HEENT No facial trauma or asymetry. Sinuses non tender.  EOMI, PERTL, fundoscopic exam  no hemorhage or exudate.  External ears normal, tympanic membranes clear. Oropharynx moist, no exudate, good dentition. Neck: supple, no adenopathy,JVD or thyromegaly.No bruits.  Chest: Adequate air entry bilaterally, scattered crackles, no wheezes tender to palpation on 3rd and 4th CC junctions on the left  Breast: No asymetry,no masses or lumps. No tenderness. No nipple discharge or inversion. No axillary or supraclavicular adenopathy  Cardiovascular system; Heart sounds normal,  S1 and  S2 ,no S3.  No murmur, or thrill.Regular rate Apical beat not  displaced Peripheral pulses normal. Abdomen: Soft, mild epigastric tenderness, no guarding or rebound, no organomegaly or masses. No bruits. Bowel sounds normal. No guarding, tenderness or rebound.    GU: External genitalia normal female genitalia , female distribution of hair. No lesions. Urethral meatus normal in size, no  Prolapse, no lesions visibly  Present. Bladder non tender. Vagina pink and moist , with no visible lesions ,white discharge present . Adequate pelvic support no  cystocele or rectocele noted Cervix pink and appears healthy, no lesions or ulcerations noted, no discharge noted from os Uterus slightly enlarged, approx 10 weeks, no adnexal masses, no cervical motion or adnexal tenderness.   Musculoskeletal exam: Full ROM of spine, hips , shoulders and knees. No deformity ,swelling or crepitus noted. No muscle wasting or atrophy.   Neurologic: Cranial nerves 2 to 12 intact. Power, tone ,sensation and reflexes normal throughout. No disturbance in gait. No tremor.  Skin: Intact, no ulceration, erythema , scaling or rash noted.Onychomycosis affecting toenails Pigmentation normal throughout  Psych; Normal mood and affect. Judgement and concentration normal       Assessment & Plan:  Acute bacterial bronchitis antioibiotic and decongestant prescribed, also fluconazole for as needed use with antibiotic  GERD (gastroesophageal reflux disease) Uncontrolled synmptonms associated with chest pain, start PPI, and   Behavior modicfication  Heavy menses Worsened menorrhagia and cramping, gynnr  To eval and manage referral entered  Chest pain, unspecified Recurrent chest pain associated with palpitations and light headedness, recently taken from her work to the ED, associated with stress, however pos f/h  of premature CAD in her  mother, will refer for  Card eval  Routine general medical examination at a health care facility Annual exam as documented. Counseling  done  re healthy lifestyle involving commitment to 150 minutes exercise per week, heart healthy diet, and attaining healthy weight.The importance of adequate sleep also discussed. Regular seat belt use  is also discussed. Changes in health habits are decided on by the patient with goals and time frames  set for achieving them. Immunization and cancer screening needs are specifically addressed at this visit.     Onychomycosis Oral antibiotic course prescribed

## 2014-02-21 NOTE — Patient Instructions (Addendum)
F/u in 4 month, call if you need me before  Weight loss goal of 6 pounds  TdAP today  You will get a message ont your lab work.  You are referred to Dr Garwin Brothers re heavy , painful menses, continue nuva ring, use back up protection if active in the next 2 month, please  You are referred to cardiology re recent chest pain accompanied by light headedness and irregular heart rate  STOP phentermine until you have had full cardiology evaluation  Today you have chest wall pain, take ibuprofen twice daily for 1 week and the protonix as well. No weight lifting for the next  3 weeks  You are treated for acute bronchitis

## 2014-02-22 ENCOUNTER — Encounter: Payer: Self-pay | Admitting: Family Medicine

## 2014-02-22 DIAGNOSIS — B351 Tinea unguium: Secondary | ICD-10-CM | POA: Insufficient documentation

## 2014-02-22 LAB — COMPREHENSIVE METABOLIC PANEL
ALT: 24 U/L (ref 0–35)
AST: 32 U/L (ref 0–37)
Albumin: 3.7 g/dL (ref 3.5–5.2)
Alkaline Phosphatase: 67 U/L (ref 39–117)
BUN: 8 mg/dL (ref 6–23)
CO2: 27 mEq/L (ref 19–32)
Calcium: 9.1 mg/dL (ref 8.4–10.5)
Chloride: 105 mEq/L (ref 96–112)
Creat: 0.72 mg/dL (ref 0.50–1.10)
Glucose, Bld: 84 mg/dL (ref 70–99)
Potassium: 4.6 mEq/L (ref 3.5–5.3)
Sodium: 138 mEq/L (ref 135–145)
Total Bilirubin: 0.4 mg/dL (ref 0.2–1.2)
Total Protein: 6.5 g/dL (ref 6.0–8.3)

## 2014-02-22 LAB — CBC WITH DIFFERENTIAL/PLATELET
Basophils Absolute: 0 10*3/uL (ref 0.0–0.1)
Basophils Relative: 1 % (ref 0–1)
Eosinophils Absolute: 0.2 10*3/uL (ref 0.0–0.7)
Eosinophils Relative: 4 % (ref 0–5)
HCT: 39.9 % (ref 36.0–46.0)
Hemoglobin: 13.1 g/dL (ref 12.0–15.0)
Lymphocytes Relative: 34 % (ref 12–46)
Lymphs Abs: 1.3 10*3/uL (ref 0.7–4.0)
MCH: 29 pg (ref 26.0–34.0)
MCHC: 32.8 g/dL (ref 30.0–36.0)
MCV: 88.3 fL (ref 78.0–100.0)
Monocytes Absolute: 0.4 10*3/uL (ref 0.1–1.0)
Monocytes Relative: 10 % (ref 3–12)
Neutro Abs: 1.9 10*3/uL (ref 1.7–7.7)
Neutrophils Relative %: 51 % (ref 43–77)
Platelets: 292 10*3/uL (ref 150–400)
RBC: 4.52 MIL/uL (ref 3.87–5.11)
RDW: 13.9 % (ref 11.5–15.5)
WBC: 3.8 10*3/uL — ABNORMAL LOW (ref 4.0–10.5)

## 2014-02-22 LAB — TSH: TSH: 2.096 u[IU]/mL (ref 0.350–4.500)

## 2014-02-22 LAB — HEMOGLOBIN A1C
Hgb A1c MFr Bld: 5.3 % (ref <5.7)
Mean Plasma Glucose: 105 mg/dL (ref <117)

## 2014-02-22 LAB — LIPID PANEL
Cholesterol: 144 mg/dL (ref 0–200)
HDL: 50 mg/dL (ref >39)
LDL Cholesterol: 80 mg/dL (ref 0–99)
Total CHOL/HDL Ratio: 2.9 Ratio
Triglycerides: 72 mg/dL (ref <150)
VLDL: 14 mg/dL (ref 0–40)

## 2014-02-22 LAB — VITAMIN D 25 HYDROXY (VIT D DEFICIENCY, FRACTURES): Vit D, 25-Hydroxy: 46 ng/mL (ref 30–89)

## 2014-02-22 MED ORDER — TERBINAFINE HCL 250 MG PO TABS: 250.0000 mg | ORAL_TABLET | Freq: Every day | ORAL | Status: DC

## 2014-02-25 ENCOUNTER — Other Ambulatory Visit: Payer: Self-pay | Admitting: Family Medicine

## 2014-02-25 DIAGNOSIS — R87610 Atypical squamous cells of undetermined significance on cytologic smear of cervix (ASC-US): Secondary | ICD-10-CM

## 2014-02-25 DIAGNOSIS — R87810 Cervical high risk human papillomavirus (HPV) DNA test positive: Principal | ICD-10-CM

## 2014-03-17 ENCOUNTER — Institutional Professional Consult (permissible substitution): Payer: 59 | Admitting: Cardiovascular Disease

## 2014-03-31 ENCOUNTER — Telehealth: Payer: Self-pay | Admitting: Family Medicine

## 2014-03-31 ENCOUNTER — Encounter: Payer: Self-pay | Admitting: Cardiovascular Disease

## 2014-03-31 NOTE — Telephone Encounter (Signed)
Spoke with Lexine Baton from Dr Garwin Brothers office she was checking about the appointment for patient spoke with her earlier about patients appointment and patient owes money and is aware to call and pay that and they will schedule her an appointment patient has been spoken to 2 times about this and she will call and schedule the appointment and pay the bill

## 2014-04-01 ENCOUNTER — Institutional Professional Consult (permissible substitution): Payer: 59 | Admitting: Cardiovascular Disease

## 2014-04-04 ENCOUNTER — Telehealth: Payer: Self-pay | Admitting: Family Medicine

## 2014-04-04 NOTE — Telephone Encounter (Signed)
Will call today and schedule appt - patient already established with Dr. Garwin Brothers.

## 2014-04-22 ENCOUNTER — Telehealth: Payer: Self-pay | Admitting: Family Medicine

## 2014-04-22 NOTE — Telephone Encounter (Signed)
noted 

## 2014-05-23 DIAGNOSIS — M940 Chondrocostal junction syndrome [Tietze]: Secondary | ICD-10-CM | POA: Insufficient documentation

## 2014-05-23 DIAGNOSIS — IMO0002 Reserved for concepts with insufficient information to code with codable children: Secondary | ICD-10-CM | POA: Insufficient documentation

## 2014-05-23 NOTE — Assessment & Plan Note (Signed)
Annual exam as documented. Counseling done  re healthy lifestyle involving commitment to 150 minutes exercise per week, heart healthy diet, and attaining healthy weight.The importance of adequate sleep also discussed. Regular seat belt use  is also discussed. Changes in health habits are decided on by the patient with goals and time frames  set for achieving them. Immunization and cancer screening needs are specifically addressed at this visit.  

## 2014-05-23 NOTE — Assessment & Plan Note (Signed)
Oral antibiotic course prescribed

## 2014-05-23 NOTE — Assessment & Plan Note (Signed)
Uncontrolled synmptonms associated with chest pain, start PPI, and   Behavior modicfication

## 2014-05-23 NOTE — Assessment & Plan Note (Signed)
Gyne referral.

## 2014-05-23 NOTE — Assessment & Plan Note (Signed)
Worsened menorrhagia and cramping, gynnr  To eval and manage referral entered

## 2014-05-23 NOTE — Assessment & Plan Note (Signed)
Recurrent chest pain associated with palpitations and light headedness, recently taken from her work to the ED, associated with stress, however pos f/h of premature CAD in her  mother, will refer for  Card eval

## 2014-05-23 NOTE — Assessment & Plan Note (Signed)
antioibiotic and decongestant prescribed, also fluconazole for as needed use with antibiotic

## 2014-05-25 ENCOUNTER — Encounter: Payer: Self-pay | Admitting: *Deleted

## 2014-05-25 ENCOUNTER — Encounter: Payer: Self-pay | Admitting: Cardiology

## 2014-05-31 ENCOUNTER — Encounter: Payer: Self-pay | Admitting: Cardiovascular Disease

## 2014-05-31 ENCOUNTER — Ambulatory Visit (INDEPENDENT_AMBULATORY_CARE_PROVIDER_SITE_OTHER): Payer: 59 | Admitting: Cardiovascular Disease

## 2014-05-31 VITALS — BP 117/79 | HR 76 | Ht 65.0 in | Wt 200.8 lb

## 2014-05-31 DIAGNOSIS — K219 Gastro-esophageal reflux disease without esophagitis: Secondary | ICD-10-CM

## 2014-05-31 DIAGNOSIS — R079 Chest pain, unspecified: Secondary | ICD-10-CM

## 2014-05-31 NOTE — Patient Instructions (Signed)
Your physician recommends that you continue on your current medications as directed. Please refer to the Current Medication list given to you today.  Your physician has requested that you have an exercise tolerance test. For further information please visit [email or web address]. Please also follow instruction sheet, as given.  Your physician recommends that you schedule a follow-up appointment As Needed

## 2014-05-31 NOTE — Addendum Note (Signed)
Addended by: Lily Kocher on: 05/31/2014 11:41 AM   Modules accepted: Orders

## 2014-05-31 NOTE — Progress Notes (Signed)
Patient ID: DESTANI WAMSER, female   DOB: [birth year], 33 y.o.   MRN: [id number]  33 yo referred by DR Moshe Cipro for chest pain.  Atypical sharp pain Seen in ER 3/15  R/O normal ECG  She thinks it was from stress Has had anxiety and panic attacks.  Also has significant GERD and indigestion.  She lives with mother and 38 year old daughter  Works at [employer] at [employer] and has the trait  No history of congenital diisease  No hisotry  of murmur syncope Never had stress test  Does get some belching with chest heaviness.  No dyspnea pleuritic or positional pain No fevers or signs of connective tissue disease      ROS: Denies fever, malais, weight loss, blurry vision, decreased visual acuity, cough, sputum, SOB, hemoptysis, pleuritic pain, palpitaitons, heartburn, abdominal pain, melena, lower extremity edema, claudication, or rash.  All other systems reviewed and negative   General: Affect appropriate Healthy:  appears stated age 30: normal Neck supple with no adenopathy JVP normal no bruits no thyromegaly Lungs clear with no wheezing and good diaphragmatic motion Heart:  S1/S2 no murmur,rub, gallop or click PMI normal Abdomen: benighn, BS positve, no tenderness, no AAA no bruit.  No HSM or HJR Distal pulses intact with no bruits No edema Neuro non-focal Skin warm and dry No muscular weakness  Medications Current Outpatient Prescriptions  Medication Sig Dispense Refill  . benzonatate (TESSALON) 100 MG capsule Take 1 capsule (100 mg total) by mouth 2 (two) times daily as needed for cough.  20 capsule  0  . ergocalciferol (VITAMIN D2) 50000 UNITS capsule Take 50,000 Units by mouth once a week. One capsule once weekly. Thursday      . fluconazole (DIFLUCAN) 150 MG tablet One tablet daily, as needed, for vaginal itch  3 tablet  0  . Glutamine 500 MG TABS Take 1 tablet by mouth daily.      Marland Kitchen ibuprofen (ADVIL,MOTRIN) 800 MG tablet One 3 times daily for 2 days before onset of menses, and  for the first 2 days of menses  60 tablet  2  . Linoleic Acid Conjugated (CLA PO) Take 1 tablet by mouth daily.      . Multiple Vitamins-Minerals (MULTIVITAMIN WITH MINERALS) tablet Take 1 tablet by mouth daily.      . Nutritional Supplements (BCAD 2 PO) Take 1 tablet by mouth daily.      Marland Kitchen etonogestrel-ethinyl estradiol (NUVARING) 0.12-0.015 MG/24HR vaginal ring Place 1 each vaginally every 21 ( twenty-one) days. Insert one (1) ring vaginally and leave in place for three (3) weeks, then remove for one (1) week.  3 each  3  . pantoprazole (PROTONIX) 40 MG tablet Take 1 tablet (40 mg total) by mouth daily.  90 tablet  0   No current facility-administered medications for this visit.    Allergies Cephalexin  Family History: Family History  Problem Relation Age of Onset  . Heart disease Mother   . Depression Mother   . COPD Maternal Grandmother     Social History: History   Social History  . Marital Status: Single    Spouse Name: N/A    Number of Children: N/A  . Years of Education: N/A   Occupational History  . Not on file.   Social History Main Topics  . Smoking status: Former Research scientist (life sciences)  . Smokeless tobacco: Not on file  . Alcohol Use: Yes     Comment: occasionally  . Drug Use: No  .  Sexual Activity: Not on file   Other Topics Concern  . Not on file   Social History Narrative  . No narrative on file    Electrocardiogram:  NSR normal ECG   Assessment and Plan

## 2014-05-31 NOTE — Assessment & Plan Note (Signed)
Atypical normal ECG  F/u ETT

## 2014-05-31 NOTE — Assessment & Plan Note (Signed)
May be related to chest pain Continue H2 blocker Low carb diet

## 2014-06-21 ENCOUNTER — Encounter: Payer: Self-pay | Admitting: Family Medicine

## 2014-06-21 ENCOUNTER — Ambulatory Visit (INDEPENDENT_AMBULATORY_CARE_PROVIDER_SITE_OTHER): Payer: 59 | Admitting: Family Medicine

## 2014-06-21 VITALS — BP 120/74 | HR 75 | Resp 16 | Ht 65.0 in | Wt 195.4 lb

## 2014-06-21 DIAGNOSIS — E669 Obesity, unspecified: Secondary | ICD-10-CM

## 2014-06-21 DIAGNOSIS — T7840XA Allergy, unspecified, initial encounter: Secondary | ICD-10-CM

## 2014-06-21 DIAGNOSIS — R6889 Other general symptoms and signs: Secondary | ICD-10-CM

## 2014-06-21 DIAGNOSIS — IMO0002 Reserved for concepts with insufficient information to code with codable children: Secondary | ICD-10-CM

## 2014-06-21 MED ORDER — EPINEPHRINE 0.3 MG/0.3ML IJ SOAJ: 0.3000 mg | Freq: Once | INTRAMUSCULAR | Status: DC

## 2014-06-21 MED ORDER — PREDNISONE (PAK) 5 MG PO TABS: 5.0000 mg | ORAL_TABLET | ORAL | Status: DC

## 2014-06-21 MED ORDER — CETIRIZINE HCL 10 MG PO TABS: 10.0000 mg | ORAL_TABLET | Freq: Every day | ORAL | Status: DC

## 2014-06-21 NOTE — Assessment & Plan Note (Signed)
Needs allergy testing, asap, depo medrol in office then prednisone  Orally, start zyrtec

## 2014-06-21 NOTE — Progress Notes (Signed)
   Subjective:    Patient ID: Janet Gonzalez, female    DOB: 24-Apr-1981, 33 y.o.   MRN: [id number]  HPI 2 month  H/o acute allergic reaction, worse in the pasdt 1 month, daily swelling of the mouth with redness and burning, , itching and redness of maxillary area of face , nose  Down to the throat, has relied on benadryl 25mg  2 to 3 times daily She has also had peri orbital  Swelling and yesterday for the first felt as though she would have difficulty swallowing, no wheeze or cough No fever , chills ,only new product that she is aware of detox treatments which she took  Twice pills involved Concerned about an area on her gum, wonders if precancerous , has been present for some time, no change noted in size and does not bleed    Review of Systems See HPI Denies recent fever or chills. Denies sinus pressure, nasal congestion, ear pain or sore throat. Denies chest congestion, productive cough or wheezing. . Denies depression, anxiety or insomnia.       Objective:   Physical Exam BP 120/74  Pulse 75  Resp 16  Ht 5\' 5"  (1.651 m)  Wt 195 lb 6.4 oz (88.633 kg)  BMI 32.52 kg/m2  SpO2 100% Patient alert and oriented and in no cardiopulmonary distress.Anxious  HEENT: No facial asymmetry, EOMI,   oropharynx pink and moist.  Neck supple no JVD, no mass. Gum examined and npo abnormality seen in area of patient's concern, no erythema , no abnormal coloration. No periorbital  Or oral  Edema, no rash on face Chest: Clear to auscultation bilaterally.  CVS: S1, S2 no murmurs, no S3.Regular rate.  ABD: Soft non tender.   Ext: No edema    CNS: CN 2-12 intact, power,  normal throughout.no focal deficits noted.        Assessment & Plan:  Allergic reaction Needs allergy testing, asap, depo medrol in office then prednisone  Orally, start zyrtec  ASCUS with positive high risk HPV Still needs gyne evaluation , referral has already been made , needs to make and keep appt  Obesity  (BMI 30.0-34.9) Deteriorated. Patient re-educated about  the importance of commitment to a  minimum of 150 minutes of exercise per week. The importance of healthy food choices with portion control discussed. Encouraged to start a food diary, count calories and to consider  joining a support group. Sample diet sheets offered. Goals set by the patient for the next several months.

## 2014-06-21 NOTE — Patient Instructions (Signed)
F/u as before  Medication is sent in as discussed  You are referred to allergist  Depo medrol in office today  Watch tender area on lower right gum, have your dentist look at it when you next go, if still a concern in the next 2 weeks, call/ send a message so I refer you to ENT

## 2014-06-22 ENCOUNTER — Telehealth: Payer: Self-pay

## 2014-06-22 DIAGNOSIS — T7840XA Allergy, unspecified, initial encounter: Secondary | ICD-10-CM

## 2014-06-22 MED ORDER — METHYLPREDNISOLONE ACETATE 80 MG/ML IJ SUSP
80.0000 mg | Freq: Once | INTRAMUSCULAR | Status: AC
  Administered 2014-06-22: 80 mg via INTRAMUSCULAR

## 2014-06-22 NOTE — Telephone Encounter (Signed)
Patient is aware 

## 2014-06-22 NOTE — Telephone Encounter (Signed)
The prednisone is only fo 6 days and the zyrtec is for 90 days , she can call Dr Ishmael Holter office, give her the number or any allergist and try to sched an appt that works for her, with our ins doubt she needs a referral but if she does she can just call back to get this done

## 2014-07-14 ENCOUNTER — Encounter: Payer: Self-pay | Admitting: Family Medicine

## 2014-07-14 ENCOUNTER — Ambulatory Visit (INDEPENDENT_AMBULATORY_CARE_PROVIDER_SITE_OTHER): Payer: 59 | Admitting: Family Medicine

## 2014-07-14 VITALS — BP 124/68 | HR 70 | Resp 18 | Ht 65.0 in | Wt 205.0 lb

## 2014-07-14 DIAGNOSIS — IMO0002 Reserved for concepts with insufficient information to code with codable children: Secondary | ICD-10-CM

## 2014-07-14 DIAGNOSIS — R350 Frequency of micturition: Secondary | ICD-10-CM

## 2014-07-14 DIAGNOSIS — R6889 Other general symptoms and signs: Secondary | ICD-10-CM

## 2014-07-14 DIAGNOSIS — B369 Superficial mycosis, unspecified: Secondary | ICD-10-CM

## 2014-07-14 DIAGNOSIS — Z5189 Encounter for other specified aftercare: Secondary | ICD-10-CM

## 2014-07-14 DIAGNOSIS — T7840XA Allergy, unspecified, initial encounter: Secondary | ICD-10-CM

## 2014-07-14 DIAGNOSIS — R079 Chest pain, unspecified: Secondary | ICD-10-CM

## 2014-07-14 DIAGNOSIS — E669 Obesity, unspecified: Secondary | ICD-10-CM

## 2014-07-14 DIAGNOSIS — F411 Generalized anxiety disorder: Secondary | ICD-10-CM

## 2014-07-14 DIAGNOSIS — T7840XD Allergy, unspecified, subsequent encounter: Secondary | ICD-10-CM

## 2014-07-14 LAB — POCT URINALYSIS DIPSTICK
Bilirubin, UA: NEGATIVE
Glucose, UA: NEGATIVE
Ketones, UA: NEGATIVE
Leukocytes, UA: NEGATIVE
Nitrite, UA: NEGATIVE
Protein, UA: NEGATIVE
RBC UA: NEGATIVE
SPEC GRAV UA: 1.02
UROBILINOGEN UA: 0.2
pH, UA: 7.5

## 2014-07-14 MED ORDER — KETOCONAZOLE 2 % EX CREA: 1.0000 "application " | TOPICAL_CREAM | Freq: Every day | CUTANEOUS | Status: DC

## 2014-07-14 MED ORDER — CIPROFLOXACIN HCL 500 MG PO TABS: 500.0000 mg | ORAL_TABLET | Freq: Two times a day (BID) | ORAL | Status: DC

## 2014-07-14 NOTE — Patient Instructions (Signed)
F/u in 3.5 month, call if you need me before  No sign of infection, urine is being sent for culture, you may need urology opinion   Gyne eval  pls get this asap..  Cream sent for rash on arm  I really hope that you feel better soon

## 2014-07-14 NOTE — Assessment & Plan Note (Addendum)
Rash on left arm x 1 week, topical prep prescribed

## 2014-07-14 NOTE — Assessment & Plan Note (Addendum)
2.5 week h/o pelvic pressure and urinary frequency, feels she has bladder infection  Neg UA, however , as she feels so strongly this has happened " in the past and she had to go to UC/ED" will send urine for c/s. Script made available for cipro in the event that symptoms worsen. I advised , that if she remains symptomatic needs urology eval as there is no evidence of infection Still needs Gyne eval for pelvic pressure

## 2014-07-16 ENCOUNTER — Encounter: Payer: Self-pay | Admitting: Family Medicine

## 2014-07-16 DIAGNOSIS — F411 Generalized anxiety disorder: Secondary | ICD-10-CM | POA: Insufficient documentation

## 2014-07-16 LAB — URINE CULTURE: Colony Count: 2000

## 2014-07-16 NOTE — Assessment & Plan Note (Signed)
Has had allergist eval, no specific allergen identified, advised to keep diary of irritants

## 2014-07-16 NOTE — Assessment & Plan Note (Signed)
Still has not had gyne eval, awaiting "timing of [place] recommended" generaly seems overwhelmed, stated that she feels she has cancer in her body, encouraged her to make the time to set and keep this appt.

## 2014-07-16 NOTE — Progress Notes (Signed)
   Subjective:    Patient ID: Janet Gonzalez, female    DOB: [birth year], 33 y.o.   MRN: [id number]  HPI 2.5 week h/o urinary frequency and pressure, flank pain , denies fever or chills, convinced that she has a UTI 1 week h/o pruritic rash on left arm, her daughter is being treated for a fungal infection and she has been using her med with success, wants her own Increased anxiety and "not doing well' Still needs gyne eval In the process of full cardiology eval Has seen allergist has topical prep for rash   Review of Systems See HPI Denies recent fever or chills. Denies sinus pressure, nasal congestion, ear pain or sore throat. Denies chest congestion, productive cough or wheezing. Denies chest pains, palpitations and leg swelling Denies abdominal pain, nausea, vomiting,diarrhea or constipation.   Denies dysuria, frequency, hesitancy or incontinence. Denies joint pain, swelling and limitation in mobility. Denies headaches, seizures, numbness, or tingling. .        Objective:   Physical Exam BP 124/68  Pulse 70  Resp 18  Ht 5\' 5"  (1.651 m)  Wt 205 lb (92.987 kg)  BMI 34.11 kg/m2  SpO2 100% Patient alert and oriented and in no cardiopulmonary distress.Anxious, poor eye contact  HEENT: No facial asymmetry, EOMI,   oropharynx pink and moist.  Neck supple no JVD, no mass.  Chest: Clear to auscultation bilaterally.  CVS: S1, S2 no murmurs, no S3.Regular rate.  ABD: Soft non tender. No renal angle or suprapubic tendrness  Ext: No edema  MS: Adequate ROM spine, shoulders, hips and knees.  Skin: Intact, no ulcerations  Noted.Fungal rash diameter approx 2 in on left upper arm outer aspect    CNS: CN 2-12 intact, power,  normal throughout.no focal deficits noted.        Assessment & Plan:

## 2014-07-16 NOTE — Assessment & Plan Note (Signed)
Uncontrolled and worsened. Pt reports not only job stress, but staes she believes she has cancer, admits to "not doing well" adamantly denies depression, not suicidal or homicidal

## 2014-07-16 NOTE — Assessment & Plan Note (Signed)
Deteriorated. Patient re-educated about  the importance of commitment to a  minimum of 150 minutes of exercise per week. The importance of healthy food choices with portion control discussed. Encouraged to start a food diary, count calories and to consider  joining a support group. Sample diet sheets offered. Goals set by the patient for the next several months.    

## 2014-07-16 NOTE — Assessment & Plan Note (Signed)
Stress tes scheduled and is to be done in the next month

## 2014-07-19 ENCOUNTER — Ambulatory Visit: Payer: 59 | Admitting: Family Medicine

## 2014-07-21 ENCOUNTER — Encounter (HOSPITAL_COMMUNITY): Payer: 59

## 2014-07-22 ENCOUNTER — Telehealth (HOSPITAL_COMMUNITY): Payer: Self-pay | Admitting: *Deleted

## 2014-09-18 NOTE — Assessment & Plan Note (Signed)
Deteriorated. Patient re-educated about  the importance of commitment to a  minimum of 150 minutes of exercise per week. The importance of healthy food choices with portion control discussed. Encouraged to start a food diary, count calories and to consider  joining a support group. Sample diet sheets offered. Goals set by the patient for the next several months.    

## 2014-09-18 NOTE — Assessment & Plan Note (Signed)
Still needs gyne evaluation , referral has already been made , needs to make and keep appt

## 2014-10-17 ENCOUNTER — Ambulatory Visit: Payer: 59 | Admitting: Family Medicine

## 2015-05-17 ENCOUNTER — Telehealth: Payer: Self-pay | Admitting: Family Medicine

## 2015-05-17 NOTE — Telephone Encounter (Signed)
Do please call patient back

## 2015-05-18 NOTE — Telephone Encounter (Signed)
Yes pls do

## 2015-05-18 NOTE — Telephone Encounter (Signed)
Please advise if you want to double book an appointment for next week?

## 2015-05-18 NOTE — Telephone Encounter (Signed)
Patient requests to be seen on Thursday 6/30

## 2015-05-18 NOTE — Telephone Encounter (Signed)
Called and left message for patient to return call.  

## 2015-05-19 NOTE — Telephone Encounter (Signed)
Pt scheduled for 8 on June 30

## 2015-05-25 ENCOUNTER — Ambulatory Visit: Payer: Self-pay | Admitting: Family Medicine

## 2015-06-08 ENCOUNTER — Ambulatory Visit: Payer: Self-pay | Admitting: Family Medicine

## 2015-06-21 ENCOUNTER — Ambulatory Visit: Payer: Self-pay | Admitting: Family Medicine

## 2015-07-18 ENCOUNTER — Ambulatory Visit (INDEPENDENT_AMBULATORY_CARE_PROVIDER_SITE_OTHER): Payer: 59 | Admitting: Family Medicine

## 2015-07-18 ENCOUNTER — Ambulatory Visit (HOSPITAL_COMMUNITY)
Admission: RE | Admit: 2015-07-18 | Discharge: 2015-07-18 | Disposition: A | Discharge status: Home / Self Care | Payer: 59 | Source: Ambulatory Visit | Attending: Family Medicine | Admitting: Family Medicine

## 2015-07-18 ENCOUNTER — Encounter: Payer: Self-pay | Admitting: Family Medicine

## 2015-07-18 VITALS — BP 120/82 | HR 81 | Resp 16 | Ht 65.0 in | Wt 219.4 lb

## 2015-07-18 DIAGNOSIS — E669 Obesity, unspecified: Secondary | ICD-10-CM

## 2015-07-18 DIAGNOSIS — G8929 Other chronic pain: Principal | ICD-10-CM

## 2015-07-18 DIAGNOSIS — Z13220 Encounter for screening for lipoid disorders: Secondary | ICD-10-CM

## 2015-07-18 DIAGNOSIS — G4452 New daily persistent headache (NDPH): Secondary | ICD-10-CM | POA: Diagnosis not present

## 2015-07-18 DIAGNOSIS — M5489 Other dorsalgia: Secondary | ICD-10-CM

## 2015-07-18 DIAGNOSIS — M546 Pain in thoracic spine: Secondary | ICD-10-CM

## 2015-07-18 DIAGNOSIS — M542 Cervicalgia: Secondary | ICD-10-CM

## 2015-07-18 DIAGNOSIS — L309 Dermatitis, unspecified: Secondary | ICD-10-CM

## 2015-07-18 DIAGNOSIS — M549 Dorsalgia, unspecified: Secondary | ICD-10-CM | POA: Insufficient documentation

## 2015-07-18 DIAGNOSIS — H539 Unspecified visual disturbance: Secondary | ICD-10-CM | POA: Diagnosis not present

## 2015-07-18 DIAGNOSIS — T7840XD Allergy, unspecified, subsequent encounter: Secondary | ICD-10-CM

## 2015-07-18 LAB — COMPREHENSIVE METABOLIC PANEL WITH GFR
ALT: 9 U/L (ref 6–29)
AST: 14 U/L (ref 10–30)
Albumin: 3.5 g/dL — ABNORMAL LOW (ref 3.6–5.1)
Alkaline Phosphatase: 65 U/L (ref 33–115)
BUN: 10 mg/dL (ref 7–25)
CO2: 27 mmol/L (ref 20–31)
Calcium: 8.6 mg/dL (ref 8.6–10.2)
Chloride: 108 mmol/L (ref 98–110)
Creat: 0.7 mg/dL (ref 0.50–1.10)
Glucose, Bld: 77 mg/dL (ref 65–99)
Potassium: 4.7 mmol/L (ref 3.5–5.3)
Sodium: 141 mmol/L (ref 135–146)
Total Bilirubin: 0.4 mg/dL (ref 0.2–1.2)
Total Protein: 6.3 g/dL (ref 6.1–8.1)

## 2015-07-18 LAB — CBC WITH DIFFERENTIAL/PLATELET
Basophils Absolute: 0 10*3/uL (ref 0.0–0.1)
Basophils Relative: 1 % (ref 0–1)
Eosinophils Absolute: 0.1 10*3/uL (ref 0.0–0.7)
Eosinophils Relative: 2 % (ref 0–5)
HCT: 39 % (ref 36.0–46.0)
Hemoglobin: 12.7 g/dL (ref 12.0–15.0)
Lymphocytes Relative: 26 % (ref 12–46)
Lymphs Abs: 1 10*3/uL (ref 0.7–4.0)
MCH: 29 pg (ref 26.0–34.0)
MCHC: 32.6 g/dL (ref 30.0–36.0)
MCV: 89 fL (ref 78.0–100.0)
MPV: 10.8 fL (ref 8.6–12.4)
Monocytes Absolute: 0.3 10*3/uL (ref 0.1–1.0)
Monocytes Relative: 8 % (ref 3–12)
Neutro Abs: 2.5 10*3/uL (ref 1.7–7.7)
Neutrophils Relative %: 63 % (ref 43–77)
Platelets: 245 10*3/uL (ref 150–400)
RBC: 4.38 MIL/uL (ref 3.87–5.11)
RDW: 14 % (ref 11.5–15.5)
WBC: 3.9 10*3/uL — ABNORMAL LOW (ref 4.0–10.5)

## 2015-07-18 LAB — LIPID PANEL
CHOL/HDL RATIO: 2.7 ratio (ref <=5.0)
Cholesterol: 135 mg/dL (ref 125–200)
HDL: 50 mg/dL (ref >=46)
LDL CALC: 65 mg/dL (ref <130)
TRIGLYCERIDES: 101 mg/dL (ref <150)
VLDL: 20 mg/dL (ref <30)

## 2015-07-18 LAB — TSH: TSH: 2.335 u[IU]/mL (ref 0.350–4.500)

## 2015-07-18 MED ORDER — CETIRIZINE HCL 10 MG PO TABS: 10.0000 mg | ORAL_TABLET | Freq: Every day | ORAL | Status: DC

## 2015-07-18 MED ORDER — GABAPENTIN 100 MG PO CAPS: 100.0000 mg | ORAL_CAPSULE | Freq: Three times a day (TID) | ORAL | Status: DC

## 2015-07-18 NOTE — Patient Instructions (Signed)
CPE in 2.5 month, call if you need me before  X ray of mid back today at [facility] please  You are referred for an MRI of neck and brain  Please schedule eye appointment due to recent episode of loss of vision  Zyrtec daily, elocon cream as needed, and gabapentin for neck pain are prescribed as discussed  Referral re neck pain will be determined after the MRI is done  CBC, fasting cmp, lipid and TSH today  New for management of pain due to nerve irritation is gabapentin 100mg .  Start one capsule at bedtime, after 3 to 5 days increase , if needed, to two capsules at bedtime, and after an additional 5 days to 3 capsules at bedtime if needed , for pain control.  Please note, the script is written as one three times daily, DO NOT take like this, instead take only at bedtime as above.  Please work on good  health habits so that your health will improve. 1. Commitment to daily physical activity for 30 to 60  minutes, if you are able to do this.  2. Commitment to wise food choices. Aim for half of your  food intake to be vegetable and fruit, one quarter starchy foods, and one quarter protein. Try to eat on a regular schedule  3 meals per day, snacking between meals should be limited to vegetables or fruits or small portions of nuts. 64 ounces of water per day is generally recommended, unless you have specific health conditions, like heart failure or kidney failure where you will need to limit fluid intake.  3. Commitment to sufficient and a  good quality of physical and mental rest daily, generally between 6 to 8 hours per day.  WITH PERSISTANCE AND PERSEVERANCE, THE IMPOSSIBLE , BECOMES THE NORM!  Thanks for choosing [facility], we consider it a privelige to serve you.

## 2015-07-18 NOTE — Progress Notes (Signed)
   Subjective:    Patient ID: Janet Gonzalez, female    DOB: 1981/04/13, 34 y.o.   MRN: [id number]  HPI  The PT is here for follow up and re-evaluation of chronic medical conditions, medication management and review of any available recent lab and radiology data.  Preventive health is updated, specifically  Cancer screening and Immunization.   Central neck pain woke with this in April, as much as a 10, radaiates to to both shoulders, and has numbness and tingling down to both hands, in past 1 month Chronic neck pain for approx 2 years, also has chronic posterior headache Had one episode of abnormal vision, distorted viosion , some areas are dark some light, , unable to see lasted approx 5 mins , was sitting on couch ,but if she had been driving would have had to pull over  Review of Systems See HPI Denies recent fever or chills. Denies sinus pressure, nasal congestion, ear pain or sore throat. Denies chest congestion, productive cough or wheezing. Denies chest pains, palpitations and leg swelling Denies abdominal pain, nausea, vomiting,diarrhea or constipation.   Denies dysuria, frequency, hesitancy or incontinence.  Denies  seizures, numbness, or tingling. Denies depression, uncontrolled  anxiety or insomnia. Denies skin break down or rash.        Objective:   Physical Exam BP 120/82 mmHg  Pulse 81  Resp 16  Ht 5\' 5"  (1.651 m)  Wt 219 lb 6.4 oz (99.519 kg)  BMI 36.51 kg/m2  SpO2 100% Patient alert and oriented and in no cardiopulmonary distress.  HEENT: No facial asymmetry, EOMI,   oropharynx pink and moist.  Neck adequate ROMe no JVD, no mass.  Chest: Clear to auscultation bilaterally.  CVS: S1, S2 no murmurs, no S3.Regular rate.  ABD: Soft non tender.   Ext: No edema  MS: Adequate ROM spine, shoulders, hips and knees.  Skin: Intact, no ulcerations or rash noted.  Psych: Good eye contact, normal affect. Memory intact not anxious or depressed appearing.  CNS:  CN 2-12 intact, power,  normal throughout.no focal deficits noted.       Assessment & Plan:  Neck pain of over 3 months duration  Neck pain radiating to head and upper extemities disabling, associated with episode of severe visual disturbance. Xray of c spine, start gabapentin and increase dose as needed/tolerated, needs MRI c spine also, had abnormal MRI C spine in 2011, with osteophyte complex  Mid back pain, chronic Trial of gabapentin and weight loss encouraged  Allergic reaction Epi pen prescribed and needs to resume daily med for prophylaxis per allergist, zyrtec has been successful  Obesity (BMI 30.0-34.9) Deteriorated. Patient re-educated about  the importance of commitment to a  minimum of 150 minutes of exercise per week.  The importance of healthy food choices with portion control discussed. Encouraged to start a food diary, count calories and to consider  joining a support group. Sample diet sheets offered. Goals set by the patient for the next several months.   Weight /BMI 07/18/2015 07/14/2014 06/21/2014  WEIGHT 219 lb 6.4 oz 205 lb 195 lb 6.4 oz  HEIGHT 5\' 5"  5\' 5"  5\' 5"   BMI 36.51 kg/m2 34.11 kg/m2 32.52 kg/m2    Current exercise per week 50 minutes.   Dermatitis Elocon cream for as needed use, no current flare  New daily persistent headache Daily headache associated with neck pain and acute visual disturbance,  Neuro exam within normal, pt to see eye specialist

## 2015-07-19 ENCOUNTER — Encounter: Payer: Self-pay | Admitting: Family Medicine

## 2015-07-26 ENCOUNTER — Ambulatory Visit (HOSPITAL_COMMUNITY): Payer: 59

## 2015-07-26 ENCOUNTER — Ambulatory Visit (HOSPITAL_COMMUNITY): Admission: RE | Admit: 2015-07-26 | Payer: 59 | Source: Ambulatory Visit

## 2015-07-28 ENCOUNTER — Ambulatory Visit (HOSPITAL_COMMUNITY)
Admission: RE | Admit: 2015-07-28 | Discharge: 2015-07-28 | Disposition: A | Discharge status: Home / Self Care | Payer: 59 | Source: Ambulatory Visit | Attending: Family Medicine | Admitting: Family Medicine

## 2015-07-28 DIAGNOSIS — M546 Pain in thoracic spine: Secondary | ICD-10-CM | POA: Insufficient documentation

## 2015-07-28 DIAGNOSIS — M542 Cervicalgia: Secondary | ICD-10-CM

## 2015-07-28 DIAGNOSIS — G8929 Other chronic pain: Secondary | ICD-10-CM

## 2015-08-07 ENCOUNTER — Encounter: Payer: Self-pay | Admitting: Family Medicine

## 2015-08-07 ENCOUNTER — Ambulatory Visit (HOSPITAL_COMMUNITY)
Admission: RE | Admit: 2015-08-07 | Discharge: 2015-08-07 | Disposition: A | Discharge status: Home / Self Care | Payer: 59 | Source: Ambulatory Visit | Attending: Family Medicine | Admitting: Family Medicine

## 2015-08-07 DIAGNOSIS — G8929 Other chronic pain: Secondary | ICD-10-CM | POA: Diagnosis not present

## 2015-08-07 DIAGNOSIS — R200 Anesthesia of skin: Secondary | ICD-10-CM | POA: Diagnosis not present

## 2015-08-07 DIAGNOSIS — G4452 New daily persistent headache (NDPH): Secondary | ICD-10-CM

## 2015-08-07 DIAGNOSIS — H539 Unspecified visual disturbance: Secondary | ICD-10-CM

## 2015-08-07 DIAGNOSIS — R51 Headache: Secondary | ICD-10-CM | POA: Diagnosis not present

## 2015-08-07 DIAGNOSIS — L309 Dermatitis, unspecified: Secondary | ICD-10-CM | POA: Insufficient documentation

## 2015-08-07 DIAGNOSIS — M542 Cervicalgia: Secondary | ICD-10-CM | POA: Insufficient documentation

## 2015-08-07 NOTE — Assessment & Plan Note (Signed)
Daily headache associated with neck pain and acute visual disturbance,  Neuro exam within normal, pt to see eye specialist

## 2015-08-07 NOTE — Assessment & Plan Note (Signed)
Elocon cream for as needed use, no current flare

## 2015-08-07 NOTE — Assessment & Plan Note (Signed)
Trial of gabapentin and weight loss encouraged

## 2015-08-07 NOTE — Assessment & Plan Note (Addendum)
Epi pen prescribed and needs to resume daily med for prophylaxis per allergist, zyrtec has been successful

## 2015-08-07 NOTE — Assessment & Plan Note (Addendum)
Neck pain radiating to head and upper extemities disabling, associated with episode of severe visual disturbance. Xray of c spine, start gabapentin and increase dose as needed/tolerated, needs MRI c spine also, had abnormal MRI C spine in 2011, with osteophyte complex

## 2015-08-07 NOTE — Assessment & Plan Note (Signed)
Deteriorated. Patient re-educated about  the importance of commitment to a  minimum of 150 minutes of exercise per week.  The importance of healthy food choices with portion control discussed. Encouraged to start a food diary, count calories and to consider  joining a support group. Sample diet sheets offered. Goals set by the patient for the next several months.   Weight /BMI 07/18/2015 07/14/2014 06/21/2014  WEIGHT 219 lb 6.4 oz 205 lb 195 lb 6.4 oz  HEIGHT 5\' 5"  5\' 5"  5\' 5"   BMI 36.51 kg/m2 34.11 kg/m2 32.52 kg/m2    Current exercise per week 50 minutes.

## 2015-08-08 ENCOUNTER — Other Ambulatory Visit: Payer: Self-pay

## 2015-08-08 DIAGNOSIS — M79603 Pain in arm, unspecified: Secondary | ICD-10-CM

## 2015-08-08 DIAGNOSIS — G542 Cervical root disorders, not elsewhere classified: Secondary | ICD-10-CM

## 2015-08-09 ENCOUNTER — Telehealth: Payer: Self-pay

## 2015-08-09 MED ORDER — TRAMADOL HCL 50 MG PO TABS: ORAL_TABLET | ORAL | Status: DC

## 2015-08-09 NOTE — Telephone Encounter (Signed)
Left message that med was being sent in

## 2015-08-09 NOTE — Telephone Encounter (Signed)
Tramadol entered for bedtime use please let her know and send in

## 2015-08-22 ENCOUNTER — Ambulatory Visit (INDEPENDENT_AMBULATORY_CARE_PROVIDER_SITE_OTHER): Payer: 59 | Admitting: Family Medicine

## 2015-08-22 ENCOUNTER — Encounter: Payer: Self-pay | Admitting: Family Medicine

## 2015-08-22 VITALS — BP 116/80 | HR 70 | Resp 16 | Ht 65.0 in | Wt 214.0 lb

## 2015-08-22 DIAGNOSIS — M5441 Lumbago with sciatica, right side: Secondary | ICD-10-CM | POA: Diagnosis not present

## 2015-08-22 DIAGNOSIS — E669 Obesity, unspecified: Secondary | ICD-10-CM

## 2015-08-22 MED ORDER — KETOROLAC TROMETHAMINE 60 MG/2ML IM SOLN
60.0000 mg | Freq: Once | INTRAMUSCULAR | Status: AC
  Administered 2015-08-22: 60 mg via INTRAMUSCULAR

## 2015-08-22 MED ORDER — RANITIDINE HCL 150 MG PO CAPS: 150.0000 mg | ORAL_CAPSULE | Freq: Two times a day (BID) | ORAL | Status: DC

## 2015-08-22 MED ORDER — IBUPROFEN 800 MG PO TABS: 800.0000 mg | ORAL_TABLET | Freq: Three times a day (TID) | ORAL | Status: DC

## 2015-08-22 MED ORDER — METHYLPREDNISOLONE ACETATE 80 MG/ML IJ SUSP
80.0000 mg | Freq: Once | INTRAMUSCULAR | Status: AC
  Administered 2015-08-22: 80 mg via INTRAMUSCULAR

## 2015-08-22 MED ORDER — PREDNISONE 5 MG (21) PO TBPK: ORAL_TABLET | ORAL | Status: DC

## 2015-08-22 NOTE — Progress Notes (Signed)
   Subjective:    Patient ID: Janet Gonzalez, female    DOB: Oct 05, 1981, 34 y.o.   MRN: [id number]  HPI 5 day h/o acute low back pain radiaiting down right leg, no specific aggravating factor, no incontinence, weakness or numbness Uncomfortable sitting position, sleep is disturbed, needs work excuse    Review of Systems See HPI Denies recent fever or chills. Denies sinus pressure, nasal congestion, ear pain or sore throat. Denies chest congestion, productive cough or wheezing. Denies chest pains, palpitations and leg swelling Denies abdominal pain, nausea, vomiting,diarrhea or constipation.   Denies dysuria, frequency, hesitancy or incontinence.        Objective:   Physical Exam BP 116/80 mmHg  Pulse 70  Resp 16  Ht 5\' 5"  (1.651 m)  Wt 214 lb (97.07 kg)  BMI 35.61 kg/m2  SpO2 100%  LMP 07/20/2015 Patient alert and oriented and in no cardiopulmonary distress.Pt in pain, no comfortable sitting position HEENT: No facial asymmetry, EOMI,   oropharynx pink and moist.  Neck supple no JVD, no mass.  Chest: Clear to auscultation bilaterally.  CVS: S1, S2 no murmurs, no S3.Regular rate.  ABD: Soft non tender.   Ext: No edema  MS: decreased ROM lumbar spine,  adequate in shoulders, hips and knees.  Skin: Intact, no ulcerations or rash noted.  Psych: Good eye contact, normal affect. Memory intact not anxious or depressed appearing.  CNS: CN 2-12 intact, power,  normal throughout.no focal deficits noted.        Assessment & Plan:   Low back pain with radiation Uncontrolled.Toradol and depo medrol administered IM in the office , to be followed by a short course of oral prednisone and NSAIDS.   Obesity (BMI 30.0-34.9) Improved. Patient re-educated about  the importance of commitment to a  minimum of 150 minutes of exercise per week.  The importance of healthy food choices with portion control discussed. Encouraged to start a food diary, count calories and to  consider  joining a support group. Sample diet sheets offered. Goals set by the patient for the next several months.   Weight /BMI 08/22/2015 07/18/2015 07/14/2014  WEIGHT 214 lb 219 lb 6.4 oz 205 lb  HEIGHT 5\' 5"  5\' 5"  5\' 5"   BMI 35.61 kg/m2 36.51 kg/m2 34.11 kg/m2    Current exercise per week 90 minutes.

## 2015-08-22 NOTE — Patient Instructions (Addendum)
Reschedule f/u wiith pap until Jan 2017, call if you need me sooner  Pls sched appt with neurosurgeon to discuss management of your neck pain .  You are treated for acute sciatic flare affecting right leg  Work excuse as discussed  Please work on good  health habits so that your health will improve. 1. Commitment to daily physical activity for 30 to 60  minutes, if you are able to do this.  2. Commitment to wise food choices. Aim for half of your  food intake to be vegetable and fruit, one quarter starchy foods, and one quarter protein. Try to eat on a regular schedule  3 meals per day, snacking between meals should be limited to vegetables or fruits or small portions of nuts. 64 ounces of water per day is generally recommended, unless you have specific health conditions, like heart failure or kidney failure where you will need to limit fluid intake.  3. Commitment to sufficient and a  good quality of physical and mental rest daily, generally between 6 to 8 hours per day.  WITH PERSISTANCE AND PERSEVERANCE, THE IMPOSSIBLE , BECOMES THE NORM!  Thanks for choosing [facility], we consider it a privelige to serve you.   OK  To  take up to two tramadol at bedtime for next 5 to 7 days for uncontrolled pain  Take ibuprofen 3 times daily for 1  Week, prednisone as prescribed for 6 days and zantac twice daily to protect your stomack for 2 weeks

## 2015-08-29 ENCOUNTER — Ambulatory Visit: Payer: 59 | Admitting: Family Medicine

## 2015-08-29 ENCOUNTER — Telehealth: Payer: Self-pay | Admitting: Family Medicine

## 2015-08-29 DIAGNOSIS — M545 Low back pain, unspecified: Secondary | ICD-10-CM | POA: Insufficient documentation

## 2015-08-29 DIAGNOSIS — M5440 Lumbago with sciatica, unspecified side: Secondary | ICD-10-CM

## 2015-08-29 NOTE — Telephone Encounter (Signed)
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 Saw Dr Garwin Brothers last year refer her back if she wishes , if not refer her to gyne of her choice and fax pap report from 01/2014 pls

## 2015-08-29 NOTE — Assessment & Plan Note (Signed)
Improved. Patient re-educated about  the importance of commitment to a  minimum of 150 minutes of exercise per week.  The importance of healthy food choices with portion control discussed. Encouraged to start a food diary, count calories and to consider  joining a support group. Sample diet sheets offered. Goals set by the patient for the next several months.   Weight /BMI 08/22/2015 07/18/2015 07/14/2014  WEIGHT 214 lb 219 lb 6.4 oz 205 lb  HEIGHT 5\' 5"  5\' 5"  5\' 5"   BMI 35.61 kg/m2 36.51 kg/m2 34.11 kg/m2    Current exercise per week 90 minutes.

## 2015-08-29 NOTE — Assessment & Plan Note (Signed)
Uncontrolled.Toradol and depo medrol administered IM in the office , to be followed by a short course of oral prednisone and NSAIDS.  

## 2015-08-30 NOTE — Telephone Encounter (Signed)
Patient aware and will call and schedule followup with Dr. Garwin Brothers

## 2015-11-01 LAB — OB RESULTS CONSOLE RUBELLA ANTIBODY, IGM: Rubella: IMMUNE

## 2015-11-01 LAB — OB RESULTS CONSOLE HIV ANTIBODY (ROUTINE TESTING): HIV: NONREACTIVE

## 2015-11-01 LAB — OB RESULTS CONSOLE RPR: RPR: NONREACTIVE

## 2015-11-01 LAB — OB RESULTS CONSOLE GC/CHLAMYDIA
CHLAMYDIA, DNA PROBE: NEGATIVE
GC PROBE AMP, GENITAL: NEGATIVE

## 2015-11-01 LAB — OB RESULTS CONSOLE ANTIBODY SCREEN: Antibody Screen: NEGATIVE

## 2015-11-01 LAB — OB RESULTS CONSOLE ABO/RH: RH Type: POSITIVE

## 2015-11-01 LAB — OB RESULTS CONSOLE HEPATITIS B SURFACE ANTIGEN: Hepatitis B Surface Ag: NEGATIVE

## 2015-11-26 NOTE — L&D Delivery Note (Signed)
Delivery Note At 5:57 PM a viable and healthy female was delivered via Vaginal, Spontaneous Delivery (Presentation: Right Occiput Anterior).  APGAR: 9, 9; weight pending .   Placenta status: Intact, Spontaneous.  Cord: 3 vessels with the following complications: .  Cord [id number]  Anesthesia: Epidural  Episiotomy: None Lacerations: 1st degree;Perineal Suture Repair: 3.0 chromic Est. Blood Loss (mL): 100  Mom to postpartum.  Baby to Couplet care / Skin to Skin.  Janet Gonzalez A 05/20/2016, 6:45 PM

## 2015-12-20 ENCOUNTER — Encounter: Payer: 59 | Admitting: Family Medicine

## 2016-02-05 NOTE — Progress Notes (Signed)
Patient ID: Janet Gonzalez, female   DOB: [birth year], 35 y.o.   MRN: [id number]   34 y.o. history of atypical sharp chest  pain Seen in ER 3/15  R/O normal ECG  She thinks it was from stress Has had anxiety and panic attacks.  Also has significant GERD and indigestion.  She lives with mother and 36 year old daughter  Works at [employer] at [employer] and has the trait  No history of congenital diisease  No hisotry  of murmur syncope Never had stress test  Does get some belching with chest heaviness.  No dyspnea pleuritic or positional pain No fevers or signs of connective tissue disease   Ordered an ETT but patient never followed up with it  Now pregnant again.  Referred by [facility] for dyspnea palpitations  And intermittent chest pain Seen by them 01/31/16 with these complaints  Pain is sharp intermittant not related to exertion all times Can be on/off all day Associated with hard beating of heart and palpitations at times Has gained 20 lbs with pregnancy Due in July About [redacted] weeks along.  Previous pregnancy normal vaginal delivery with no pre eclampsia or gestational DM   ROS: Denies fever, malais, weight loss, blurry vision, decreased visual acuity, cough, sputum, SOB, hemoptysis, pleuritic pain, palpitaitons, heartburn, abdominal pain, melena, lower extremity edema, claudication, or rash.  All other systems reviewed and negative   General: Affect appropriate Overweight black pregnant female  HEENT: normal Neck supple with no adenopathy JVP normal no bruits no thyromegaly Lungs clear with no wheezing and good diaphragmatic motion Heart:  S1/S2 no murmur,rub, gallop or click PMI normal Abdomen: benighn, BS positve, no tenderness, no AAA no bruit.  No HSM or HJR Distal pulses intact with no bruits No edema Neuro non-focal Skin warm and dry No muscular weakness  Medications Current Outpatient Prescriptions  Medication Sig Dispense Refill  . Carbonyl Iron 25 MG TABS Take 1  tablet by mouth daily.    Marland Kitchen EPINEPHrine 0.3 mg/0.3 mL IJ SOAJ injection Inject 0.3 mLs (0.3 mg total) into the muscle once. 2 Device 1  . folic acid (FOLVITE) A999333 MCG tablet Take 400 mcg by mouth daily.    . Multiple Vitamins-Minerals (MULTIVITAMIN WITH MINERALS) tablet Take 1 tablet by mouth daily.    . ranitidine (ZANTAC) 150 MG capsule Take 150 mg by mouth daily as needed for heartburn.     No current facility-administered medications for this visit.    Allergies Cephalexin and Gabapentin  Family History: Family History  Problem Relation Age of Onset  . Heart disease Mother   . Depression Mother   . COPD Maternal Grandmother     Social History: Social History   Social History  . Marital Status: Single    Spouse Name: N/A  . Number of Children: N/A  . Years of Education: N/A   Occupational History  . Not on file.   Social History Main Topics  . Smoking status: Former Research scientist (life sciences)  . Smokeless tobacco: Not on file  . Alcohol Use: Yes     Comment: occasionally  . Drug Use: No  . Sexual Activity: Not on file   Other Topics Concern  . Not on file   Social History Narrative    Electrocardiogram:   02/07/16   NSR normal ECG   Assessment and Plan Chest Pain:  Atypical normal ECG f/u ETT can be low level  GERD: antacids as needed related to pregnancy and weight gan  Dyspnea:  F/u echo normal ECG and no murmur on exam. Anxiety chronic   Jenkins Rouge

## 2016-02-07 ENCOUNTER — Ambulatory Visit (INDEPENDENT_AMBULATORY_CARE_PROVIDER_SITE_OTHER): Payer: 59 | Admitting: Cardiovascular Disease

## 2016-02-07 ENCOUNTER — Encounter: Payer: Self-pay | Admitting: Cardiovascular Disease

## 2016-02-07 VITALS — BP 118/70 | HR 76 | Ht 65.0 in | Wt 235.8 lb

## 2016-02-07 DIAGNOSIS — R0789 Other chest pain: Secondary | ICD-10-CM | POA: Diagnosis not present

## 2016-02-07 DIAGNOSIS — R0602 Shortness of breath: Secondary | ICD-10-CM

## 2016-02-07 DIAGNOSIS — R0600 Dyspnea, unspecified: Secondary | ICD-10-CM | POA: Diagnosis not present

## 2016-02-07 NOTE — Patient Instructions (Signed)
Medication Instructions:  Your physician recommends that you continue on your current medications as directed. Please refer to the Current Medication list given to you today.  Labwork: NONE  Testing/Procedures: Your physician has requested that you have an exercise tolerance test. For further information please visit [email or web address]. Please also follow instruction sheet, as given.  Your physician has requested that you have an echocardiogram. Echocardiography is a painless test that uses sound waves to create images of your heart. It provides your doctor with information about the size and shape of your heart and how well your heart's chambers and valves are working. This procedure takes approximately one hour. There are no restrictions for this procedure.  Follow-Up: Your physician wants you to follow-up as needed.    If you need a refill on your cardiac medications before your next appointment, please call your pharmacy.

## 2016-02-22 ENCOUNTER — Ambulatory Visit (HOSPITAL_COMMUNITY): Payer: 59

## 2016-02-29 ENCOUNTER — Ambulatory Visit (INDEPENDENT_AMBULATORY_CARE_PROVIDER_SITE_OTHER): Payer: 59

## 2016-02-29 DIAGNOSIS — R0789 Other chest pain: Secondary | ICD-10-CM

## 2016-03-01 LAB — EXERCISE TOLERANCE TEST
CHL CUP RESTING HR STRESS: 85 {beats}/min
CSEPED: 6 min
Estimated workload: 8 METS
Exercise duration (sec): 4 s
MPHR: 185 {beats}/min
Peak HR: 166 {beats}/min
Percent HR: 89 %
RPE: 16

## 2016-03-04 ENCOUNTER — Telehealth: Payer: Self-pay

## 2016-03-04 NOTE — Telephone Encounter (Signed)
States Dr Garwin Brothers referred her to cardiology and she had an appt on 02/07/2016 for evaluation. Her insurance won't pay for this visit and states the referral has to come from her PCP. Ok to enter referral for SOB and chest pain and back date to 3/15?

## 2016-03-04 NOTE — Telephone Encounter (Signed)
Yes I will sign pls send for Dr Garwin Brothers note I have sent her to cardiology not too long ago, pls send for Dr Barrett Shell note also  Tell Janet Gonzalez once verified that I hope she does well with her pregnancy, and look forward to meeting baby #2!

## 2016-03-04 NOTE — Telephone Encounter (Signed)
Sent for note from cousins

## 2016-03-05 ENCOUNTER — Ambulatory Visit (HOSPITAL_COMMUNITY): Payer: 59

## 2016-03-14 ENCOUNTER — Telehealth: Payer: Self-pay | Admitting: Cardiovascular Disease

## 2016-03-14 NOTE — Telephone Encounter (Signed)
Sent for note from dr cousins

## 2016-03-14 NOTE — Telephone Encounter (Signed)
New Message:  Pt is calling in to see if her PCP Tula Nakayama send in the referral to cover the appts when she saw Dr. Johnsie Cancel and when she had the Echo done. Please f/u with her

## 2016-03-14 NOTE — Telephone Encounter (Signed)
Left message to call back. There is a referral for cardiology on patient's appointment page.

## 2016-03-27 ENCOUNTER — Encounter: Payer: 59 | Attending: Obstetrics and Gynecology

## 2016-03-27 VITALS — Wt 237.0 lb

## 2016-03-27 DIAGNOSIS — O24410 Gestational diabetes mellitus in pregnancy, diet controlled: Secondary | ICD-10-CM

## 2016-03-27 DIAGNOSIS — O24419 Gestational diabetes mellitus in pregnancy, unspecified control: Secondary | ICD-10-CM | POA: Diagnosis present

## 2016-04-22 NOTE — Patient Instructions (Signed)
Test blood sugars before breakfast and 2 hours after eating

## 2016-04-22 NOTE — Progress Notes (Signed)
  Patient was seen on 03/27/16 for Gestational Diabetes self-management class at [facility]. The following learning objectives were met by the patient during this course:   States the definition of Gestational Diabetes  States why dietary management is important in controlling blood glucose  Describes the effects each nutrient has on blood glucose levels  Demonstrates ability to create a balanced meal plan  Demonstrates carbohydrate counting   States when to check blood glucose levels  Demonstrates proper blood glucose monitoring techniques  States the effect of stress and exercise on blood glucose levels  States the importance of limiting caffeine and abstaining from alcohol and smoking  Blood glucose monitor given: One Touch Verio Lot # [id number] Exp:06/24/17 Blood glucose reading: 79  Patient instructed to monitor glucose levels: FBS: 60 - <90 2 hour: <120  *Patient received handouts:  Nutrition Diabetes and Pregnancy  Carbohydrate Counting List  Patient will be seen for follow-up as needed.

## 2016-04-26 LAB — OB RESULTS CONSOLE GBS: STREP GROUP B AG: NEGATIVE

## 2016-05-17 ENCOUNTER — Other Ambulatory Visit: Payer: Self-pay | Admitting: Obstetrics and Gynecology

## 2016-05-19 ENCOUNTER — Encounter (HOSPITAL_COMMUNITY): Payer: Self-pay

## 2016-05-19 ENCOUNTER — Inpatient Hospital Stay (HOSPITAL_COMMUNITY)
Admission: RE | Admit: 2016-05-19 | Discharge: 2016-05-22 | DRG: 775 | Disposition: A | Discharge status: Home / Self Care | Payer: 59 | Source: Ambulatory Visit | Attending: Obstetrics and Gynecology | Admitting: Obstetrics and Gynecology

## 2016-05-19 DIAGNOSIS — O24420 Gestational diabetes mellitus in childbirth, diet controlled: Secondary | ICD-10-CM | POA: Diagnosis present

## 2016-05-19 DIAGNOSIS — Z825 Family history of asthma and other chronic lower respiratory diseases: Secondary | ICD-10-CM

## 2016-05-19 DIAGNOSIS — E890 Postprocedural hypothyroidism: Secondary | ICD-10-CM | POA: Diagnosis present

## 2016-05-19 DIAGNOSIS — Z818 Family history of other mental and behavioral disorders: Secondary | ICD-10-CM | POA: Diagnosis not present

## 2016-05-19 DIAGNOSIS — Z3A39 39 weeks gestation of pregnancy: Secondary | ICD-10-CM

## 2016-05-19 DIAGNOSIS — O24410 Gestational diabetes mellitus in pregnancy, diet controlled: Secondary | ICD-10-CM | POA: Diagnosis present

## 2016-05-19 DIAGNOSIS — Z87891 Personal history of nicotine dependence: Secondary | ICD-10-CM

## 2016-05-19 DIAGNOSIS — O1205 Gestational edema, complicating the puerperium: Secondary | ICD-10-CM | POA: Diagnosis not present

## 2016-05-19 DIAGNOSIS — Z9889 Other specified postprocedural states: Secondary | ICD-10-CM | POA: Diagnosis not present

## 2016-05-19 DIAGNOSIS — Z8249 Family history of ischemic heart disease and other diseases of the circulatory system: Secondary | ICD-10-CM | POA: Diagnosis not present

## 2016-05-19 DIAGNOSIS — R609 Edema, unspecified: Secondary | ICD-10-CM | POA: Diagnosis not present

## 2016-05-19 HISTORY — DX: Gestational edema, complicating the puerperium: O12.05

## 2016-05-19 LAB — TYPE AND SCREEN
ABO/RH(D): B POS
ANTIBODY SCREEN: NEGATIVE

## 2016-05-19 LAB — GLUCOSE, CAPILLARY
GLUCOSE-CAPILLARY: 92 mg/dL (ref 65–99)
GLUCOSE-CAPILLARY: 83 mg/dL (ref 65–99)
GLUCOSE-CAPILLARY: 136 mg/dL — AB (ref 65–99)
Glucose-Capillary: 98 mg/dL (ref 65–99)
Glucose-Capillary: 118 mg/dL — ABNORMAL HIGH (ref 65–99)
Glucose-Capillary: 95 mg/dL (ref 65–99)

## 2016-05-19 LAB — CBC
HCT: 34.8 % — ABNORMAL LOW (ref 36.0–46.0)
HEMOGLOBIN: 12.1 g/dL (ref 12.0–15.0)
MCH: 28.7 pg (ref 26.0–34.0)
MCHC: 34.8 g/dL (ref 30.0–36.0)
MCV: 82.7 fL (ref 78.0–100.0)
Platelets: 266 10*3/uL (ref 150–400)
RBC: 4.21 MIL/uL (ref 3.87–5.11)
RDW: 14.5 % (ref 11.5–15.5)
WBC: 7.1 10*3/uL (ref 4.0–10.5)

## 2016-05-19 LAB — ABO/RH: ABO/RH(D): B POS

## 2016-05-19 LAB — RPR: RPR Ser Ql: NONREACTIVE

## 2016-05-19 MED ORDER — OXYTOCIN 40 UNITS IN LACTATED RINGERS INFUSION - SIMPLE MED: 1.0000 m[IU]/min | INTRAVENOUS | Status: DC

## 2016-05-19 MED ORDER — OXYCODONE-ACETAMINOPHEN 5-325 MG PO TABS: 1.0000 | ORAL_TABLET | ORAL | Status: DC | PRN

## 2016-05-19 MED ORDER — ZOLPIDEM TARTRATE 5 MG PO TABS
5.0000 mg | ORAL_TABLET | Freq: Every evening | ORAL | Status: DC | PRN
  Administered 2016-05-19: 5 mg via ORAL
  Filled 2016-05-19: qty 1

## 2016-05-19 MED ORDER — OXYTOCIN 10 UNIT/ML IJ SOLN: 10.0000 [IU] | Freq: Once | INTRAMUSCULAR | Status: DC

## 2016-05-19 MED ORDER — OXYTOCIN 40 UNITS IN LACTATED RINGERS INFUSION - SIMPLE MED
1.0000 m[IU]/min | INTRAVENOUS | Status: DC
  Administered 2016-05-19: 2 m[IU]/min via INTRAVENOUS

## 2016-05-19 MED ORDER — OXYTOCIN BOLUS FROM INFUSION: 500.0000 mL | INTRAVENOUS | Status: DC

## 2016-05-19 MED ORDER — OXYCODONE-ACETAMINOPHEN 5-325 MG PO TABS: 2.0000 | ORAL_TABLET | ORAL | Status: DC | PRN

## 2016-05-19 MED ORDER — MISOPROSTOL 25 MCG QUARTER TABLET
25.0000 ug | ORAL_TABLET | ORAL | Status: DC | PRN
  Administered 2016-05-19 (×2): 25 ug via VAGINAL
  Filled 2016-05-19: qty 0.25
  Filled 2016-05-19: qty 1
  Filled 2016-05-19: qty 0.25

## 2016-05-19 MED ORDER — ACETAMINOPHEN 325 MG PO TABS
650.0000 mg | ORAL_TABLET | ORAL | Status: DC | PRN
  Administered 2016-05-19: 650 mg via ORAL
  Filled 2016-05-19: qty 2

## 2016-05-19 MED ORDER — ONDANSETRON HCL 4 MG/2ML IJ SOLN: 4.0000 mg | Freq: Four times a day (QID) | INTRAMUSCULAR | Status: DC | PRN

## 2016-05-19 MED ORDER — LACTATED RINGERS IV SOLN: 500.0000 mL | INTRAVENOUS | Status: DC | PRN

## 2016-05-19 MED ORDER — TERBUTALINE SULFATE 1 MG/ML IJ SOLN
0.2500 mg | Freq: Once | INTRAMUSCULAR | Status: DC | PRN
Start: 2016-05-19 — End: 2016-05-20
  Filled 2016-05-19: qty 1

## 2016-05-19 MED ORDER — MISOPROSTOL 200 MCG PO TABS
50.0000 ug | ORAL_TABLET | Freq: Four times a day (QID) | ORAL | Status: DC
  Administered 2016-05-19 – 2016-05-20 (×3): 50 ug via ORAL
  Filled 2016-05-19 (×3): qty 0.5

## 2016-05-19 MED ORDER — LACTATED RINGERS IV SOLN
INTRAVENOUS | Status: DC
  Administered 2016-05-19 – 2016-05-20 (×5): via INTRAVENOUS

## 2016-05-19 MED ORDER — LIDOCAINE HCL (PF) 1 % IJ SOLN
30.0000 mL | INTRAMUSCULAR | Status: DC | PRN
  Filled 2016-05-19: qty 30

## 2016-05-19 MED ORDER — OXYTOCIN 40 UNITS IN LACTATED RINGERS INFUSION - SIMPLE MED
2.5000 [IU]/h | INTRAVENOUS | Status: DC
  Filled 2016-05-19: qty 1000

## 2016-05-19 MED ORDER — TERBUTALINE SULFATE 1 MG/ML IJ SOLN
0.2500 mg | Freq: Once | INTRAMUSCULAR | Status: DC | PRN
  Filled 2016-05-19: qty 1

## 2016-05-19 MED ORDER — SOD CITRATE-CITRIC ACID 500-334 MG/5ML PO SOLN: 30.0000 mL | ORAL | Status: DC | PRN

## 2016-05-19 NOTE — Anesthesia Pain Management Evaluation Note (Signed)
  CRNA Pain Management Visit Note  Patient: Janet Gonzalez, 35 y.o., female  "Hello I am a member of the anesthesia team at [facility]. We have an anesthesia team available at all times to provide care throughout the hospital, including epidural management and anesthesia for C-section. I don't know your plan for the delivery whether it a natural birth, water birth, IV sedation, nitrous supplementation, doula or epidural, but we want to meet your pain goals."   1.Was your pain managed to your expectations on prior hospitalizations?   No prior hospitalizations  2.What is your expectation for pain management during this hospitalization?     Labor support without medications, Epidural and Nitrous Oxide  3.How can we help you reach that goal? Open to anesthetic options   Record the patient's initial score and the patient's pain goal.   Pain: 2  Pain Goal: 6 [facility] wants you to be able to say your pain was always managed very well.  Jasman Pfeifle 05/19/2016

## 2016-05-19 NOTE — Progress Notes (Signed)
S: s/p cytotec #2 (+) cramping  O: VE 2/thick/-3 Tracing reactive baseline 140 Ctx q 3-4 mins  IMP: Class A1 GDM Term gestation P) cont present mgmt. Pitocin augmentation if unable to add cytotec again CBG check

## 2016-05-19 NOTE — H&P (Signed)
Janet Gonzalez is a 35 y.o. female presenting for IOL 2nd to Class A1 GDM Maternal Medical History:  Prenatal Complications - Diabetes: gestational. Diabetes is managed by diet.      OB History    Gravida Para Term Preterm AB TAB SAB Ectopic Multiple Living   2 1 1       1      Past Medical History  Diagnosis Date  . Anxiety   . Unspecified vitamin D deficiency   . Onychomycosis   . OBESITY     Qualifier: Diagnosis of  By: Rebecca Eaton LPN, [place] Cerise    . Menorrhagia with regular cycle   . GERD (gastroesophageal reflux disease)   . Fibroid uterus   . Chest pain, unspecified   . ASCUS with positive high risk HPV     Dx 01/2014   . Acute costochondritis   . Acute bacterial bronchitis    Past Surgical History  Procedure Laterality Date  . Thyroidectomy, partial    . Wisdom tooth extraction     Family History: family history includes COPD in her maternal grandmother; Depression in her mother; Heart disease in her mother. Social History:  reports that she has quit smoking. She does not have any smokeless tobacco history on file. She reports that she drinks alcohol. She reports that she does not use illicit drugs.   Prenatal Transfer Tool  Maternal Diabetes: Yes:  Diabetes Type:  Diet controlled Genetic Screening: Normal Maternal Ultrasounds/Referrals: Abnormal:  Findings:   Isolated EIF (echogenic intracardiac focus) Fetal Ultrasounds or other Referrals:  None Maternal Substance Abuse:  No Significant Maternal Medications:  None Significant Maternal Lab Results:  Lab values include: Group B Strep negative Other Comments:  None  Review of Systems  All other systems reviewed and are negative.   Dilation: 1 Effacement (%): Thick Station: -3 Exam by:: V Rogers RN  Blood pressure 113/64, pulse 83, temperature 98.5 F (36.9 C), temperature source Oral, resp. rate 16, height 5\' 5"  (1.651 m), weight 112.492 kg (248 lb), last menstrual period 08/20/2015. Maternal Exam:  Uterine  Assessment: Contraction frequency is rare.   Abdomen: Patient reports no abdominal tenderness. Estimated fetal weight is 8lb.   Fetal presentation: vertex  Introitus: Normal vulva. Amniotic fluid character: not assessed.  Pelvis: adequate for delivery.   Cervix: Cervix evaluated by digital exam.     Fetal Exam Fetal Monitor Review: Baseline rate: 145.  Variability: moderate (6-25 bpm).    Fetal State Assessment: Category I - tracings are normal.     Physical Exam  Constitutional: She is oriented to person, place, and time. She appears well-developed and well-nourished.  HENT:  Head: Atraumatic.  Neck: Neck supple.  Cardiovascular: Regular rhythm.   Neurological: She is alert and oriented to person, place, and time.  Skin: Skin is warm and dry.  Psychiatric: She has a normal mood and affect.    Prenatal labs: ABO, Rh: --/--/B POS, B POS (06/25 0136) Antibody: NEG (06/25 0136) Rubella: Immune (12/07 0000) RPR: Nonreactive (12/07 0000)  HBsAg: Negative (12/07 0000)  HIV: Non-reactive (12/07 0000)  GBS: Negative (06/02 0000)   Assessment/Plan: Class A1 GDM Term gestation P) admit routine labs. CBG testing, cytotec for cervical ripening. Analgesic prn   Larrissa Stivers A 05/19/2016, 5:36 AM

## 2016-05-19 NOTE — Progress Notes (Signed)
S: on the ball  O; Pitocin 18 miu VE unchanged  Tracing: baseline 145 some accel ? Ctx q 2-4 mins  CBG (last 3)   Recent Labs  05/19/16 1007 05/19/16 1344 05/19/16 1751  GLUCAP 98 118* 95    IMP; Class A1 GDM Term gestation P) d/c pitocin @ 5:30 pm if unchanged. When ctx spaces, oral cytotec

## 2016-05-20 ENCOUNTER — Inpatient Hospital Stay (HOSPITAL_COMMUNITY): Payer: 59 | Admitting: Anesthesiology

## 2016-05-20 ENCOUNTER — Encounter (HOSPITAL_COMMUNITY): Payer: Self-pay

## 2016-05-20 LAB — GLUCOSE, CAPILLARY
GLUCOSE-CAPILLARY: 77 mg/dL (ref 65–99)
GLUCOSE-CAPILLARY: 97 mg/dL (ref 65–99)
Glucose-Capillary: 76 mg/dL (ref 65–99)
Glucose-Capillary: 107 mg/dL — ABNORMAL HIGH (ref 65–99)
Glucose-Capillary: 79 mg/dL (ref 65–99)

## 2016-05-20 MED ORDER — FENTANYL 2.5 MCG/ML BUPIVACAINE 1/10 % EPIDURAL INFUSION (WH - ANES)
14.0000 mL/h | INTRAMUSCULAR | Status: DC | PRN
  Administered 2016-05-20: 12 mL/h via EPIDURAL
  Filled 2016-05-20: qty 125

## 2016-05-20 MED ORDER — IBUPROFEN 600 MG PO TABS
600.0000 mg | ORAL_TABLET | Freq: Four times a day (QID) | ORAL | Status: DC
Start: 2016-05-20 — End: 2016-05-22
  Administered 2016-05-20 – 2016-05-22 (×6): 600 mg via ORAL
  Filled 2016-05-20 (×6): qty 1

## 2016-05-20 MED ORDER — LACTATED RINGERS IV SOLN
500.0000 mL | Freq: Once | INTRAVENOUS | Status: AC
  Administered 2016-05-20: 500 mL via INTRAVENOUS

## 2016-05-20 MED ORDER — ACETAMINOPHEN 325 MG PO TABS
650.0000 mg | ORAL_TABLET | ORAL | Status: DC | PRN
  Administered 2016-05-20: 650 mg via ORAL
  Filled 2016-05-20: qty 2

## 2016-05-20 MED ORDER — ZOLPIDEM TARTRATE 5 MG PO TABS: 5.0000 mg | ORAL_TABLET | Freq: Every evening | ORAL | Status: DC | PRN

## 2016-05-20 MED ORDER — PHENYLEPHRINE 40 MCG/ML (10ML) SYRINGE FOR IV PUSH (FOR BLOOD PRESSURE SUPPORT)
80.0000 ug | PREFILLED_SYRINGE | INTRAVENOUS | Status: DC | PRN
  Filled 2016-05-20: qty 5

## 2016-05-20 MED ORDER — ONDANSETRON HCL 4 MG/2ML IJ SOLN: 4.0000 mg | INTRAMUSCULAR | Status: DC | PRN

## 2016-05-20 MED ORDER — BENZOCAINE-MENTHOL 20-0.5 % EX AERO
1.0000 "application " | INHALATION_SPRAY | CUTANEOUS | Status: DC | PRN
  Administered 2016-05-20: 1 via TOPICAL
  Filled 2016-05-20: qty 56

## 2016-05-20 MED ORDER — SIMETHICONE 80 MG PO CHEW: 80.0000 mg | CHEWABLE_TABLET | ORAL | Status: DC | PRN

## 2016-05-20 MED ORDER — ONDANSETRON HCL 4 MG PO TABS: 4.0000 mg | ORAL_TABLET | ORAL | Status: DC | PRN

## 2016-05-20 MED ORDER — EPHEDRINE 5 MG/ML INJ
10.0000 mg | INTRAVENOUS | Status: DC | PRN
  Filled 2016-05-20: qty 2

## 2016-05-20 MED ORDER — SENNOSIDES-DOCUSATE SODIUM 8.6-50 MG PO TABS
2.0000 | ORAL_TABLET | ORAL | Status: DC
  Administered 2016-05-20 – 2016-05-21 (×2): 2 via ORAL
  Filled 2016-05-20 (×2): qty 2

## 2016-05-20 MED ORDER — BUPIVACAINE HCL (PF) 0.25 % IJ SOLN
INTRAMUSCULAR | Status: DC | PRN
  Administered 2016-05-20 (×2): 4 mL via EPIDURAL

## 2016-05-20 MED ORDER — OXYTOCIN 40 UNITS IN LACTATED RINGERS INFUSION - SIMPLE MED
1.0000 m[IU]/min | INTRAVENOUS | Status: DC
  Administered 2016-05-20: 2 m[IU]/min via INTRAVENOUS
  Administered 2016-05-20: 666 m[IU]/min via INTRAVENOUS
  Filled 2016-05-20: qty 1000

## 2016-05-20 MED ORDER — PHENYLEPHRINE 40 MCG/ML (10ML) SYRINGE FOR IV PUSH (FOR BLOOD PRESSURE SUPPORT)
80.0000 ug | PREFILLED_SYRINGE | INTRAVENOUS | Status: DC | PRN
  Filled 2016-05-20: qty 10
  Filled 2016-05-20: qty 5

## 2016-05-20 MED ORDER — DIPHENHYDRAMINE HCL 50 MG/ML IJ SOLN: 12.5000 mg | INTRAMUSCULAR | Status: DC | PRN

## 2016-05-20 MED ORDER — WITCH HAZEL-GLYCERIN EX PADS
1.0000 "application " | MEDICATED_PAD | CUTANEOUS | Status: DC | PRN
  Administered 2016-05-21: 1 via TOPICAL

## 2016-05-20 MED ORDER — COCONUT OIL OIL
1.0000 "application " | TOPICAL_OIL | Status: DC | PRN
  Filled 2016-05-20: qty 120

## 2016-05-20 MED ORDER — DIPHENHYDRAMINE HCL 25 MG PO CAPS: 25.0000 mg | ORAL_CAPSULE | Freq: Four times a day (QID) | ORAL | Status: DC | PRN

## 2016-05-20 MED ORDER — FERROUS SULFATE 325 (65 FE) MG PO TABS
325.0000 mg | ORAL_TABLET | Freq: Two times a day (BID) | ORAL | Status: DC
  Administered 2016-05-21 (×2): 325 mg via ORAL
  Filled 2016-05-20 (×3): qty 1

## 2016-05-20 MED ORDER — LIDOCAINE-EPINEPHRINE (PF) 2 %-1:200000 IJ SOLN
INTRAMUSCULAR | Status: DC | PRN
  Administered 2016-05-20: 3 mL

## 2016-05-20 MED ORDER — DIBUCAINE 1 % RE OINT
1.0000 "application " | TOPICAL_OINTMENT | RECTAL | Status: DC | PRN
  Administered 2016-05-21: 1 via RECTAL
  Filled 2016-05-20: qty 28

## 2016-05-20 MED ORDER — PRENATAL MULTIVITAMIN CH
1.0000 | ORAL_TABLET | Freq: Every day | ORAL | Status: DC
  Filled 2016-05-20: qty 1

## 2016-05-20 NOTE — Anesthesia Procedure Notes (Signed)
Epidural Patient location during procedure: OB  Staffing Anesthesiologist: Edin Skarda Performed by: anesthesiologist   Preanesthetic Checklist Completed: patient identified, surgical consent, pre-op evaluation, timeout performed, IV checked, risks and benefits discussed and monitors and equipment checked  Epidural Patient position: sitting Prep: DuraPrep Patient monitoring: heart rate, cardiac monitor, continuous pulse ox and blood pressure Approach: midline Location: L3-L4 Injection technique: LOR saline  Needle:  Needle type: Tuohy  Needle gauge: 17 G Needle length: 9 cm Needle insertion depth: 9 cm Catheter type: closed end flexible Catheter size: 19 Gauge Catheter at skin depth: 15 cm Test dose: negative and 2% lidocaine with Epi 1:200 K  Assessment Events: blood not aspirated, injection not painful, no injection resistance, negative IV test and no paresthesia  Additional Notes Reason for block:procedure for pain

## 2016-05-20 NOTE — Progress Notes (Signed)
S; 2nd oral cytotec at 12 am Sleeping  O: VE deferred Tracing cat 1 irreg ctx  CBG (last 3)   Recent Labs  05/19/16 2204 05/20/16 0013 05/20/16 0401  GLUCAP 136* 77 76    IMP: Class A1 GDM Term gestation P) cont present mgmt

## 2016-05-20 NOTE — Progress Notes (Signed)
This note also relates to the following rows which could not be included: BP - Cannot attach notes to unvalidated device data Pulse Rate - Cannot attach notes to unvalidated device data   Placenta delivered spontaneous and intact

## 2016-05-20 NOTE — Progress Notes (Signed)
S: pain scale unchanged Breathing with ctx  O: BP 105/88 mmHg  Pulse 93  Temp(Src) 98.1 F (36.7 C) (Oral)  Resp 17  Ht 5\' 5"  (1.651 m)  Wt 112.492 kg (248 lb)  BMI 41.27 kg/m2  LMP 08/20/2015 VE 4/60/-3/-2 applied asynclitic AROM clear fluid  Tracing: baseline 145 (+) accels  Ctx q 2-8 mins CBG (last 3)   Recent Labs  05/20/16 0013 05/20/16 0401 05/20/16 0818  GLUCAP 77 76 107*    IMP: Class A1 GDM  term gestation P) right exaggerated sims. Pitocin augmentation @ 2pm. Analgesic prn

## 2016-05-20 NOTE — Progress Notes (Signed)
Janet Gonzalez is a 35 y.o. G2P1001 at [redacted]w[redacted]d by ultrasound admitted for induction of labor due to Gestational diabetes.  Subjective: No chief complaint on file.  Notes more painful ctx with cytotec than pitocin Due for 3rd oral dose Objective: BP 118/68 mmHg  Pulse 75  Temp(Src) 97.3 F (36.3 C) (Oral)  Resp 16  Ht 5\' 5"  (1.651 m)  Wt 112.492 kg (248 lb)  BMI 41.27 kg/m2  LMP 08/20/2015      FHT:  FHR: 145 bpm, variability: moderate,  accelerations:  Present,  decelerations:  Absent UC:   irreg SVE:  Loose 3 cm dilated, 40% effaced, -3/-2 station Tracing: cat 1  Labs: Lab Results  Component Value Date   WBC 7.1 05/19/2016   HGB 12.1 05/19/2016   HCT 34.8* 05/19/2016   MCV 82.7 05/19/2016   PLT 266 05/19/2016   CBG (last 3)   Recent Labs  05/19/16 2204 05/20/16 0013 05/20/16 0401  GLUCAP 136* 77 76      Assessment / Plan: Class A1 GDM Term gestation P) saline lock. Light meal. Resume oral cytotec thereafter. Defer amniotomy  Anticipated MOD:  NSVD  Breck Maryland A 05/20/2016, 6:07 AM

## 2016-05-20 NOTE — Anesthesia Preprocedure Evaluation (Signed)
Anesthesia Evaluation  Patient identified by MRN, date of birth, ID band Patient awake    Reviewed: Allergy & Precautions, Patient's Chart, lab work & pertinent test results  History of Anesthesia Complications Negative for: history of anesthetic complications  Airway Mallampati: III  TM Distance: >3 FB Neck ROM: Full    Dental  (+) Teeth Intact   Pulmonary neg pulmonary ROS, former smoker,    breath sounds clear to auscultation       Cardiovascular negative cardio ROS   Rhythm:Regular     Neuro/Psych  Headaches, neg Seizures PSYCHIATRIC DISORDERS Anxiety    GI/Hepatic Neg liver ROS, GERD  Medicated and Controlled,  Endo/Other  diabetes, GestationalMorbid obesity  Renal/GU negative Renal ROS     Musculoskeletal   Abdominal   Peds  Hematology negative hematology ROS (+)   Anesthesia Other Findings   Reproductive/Obstetrics (+) Pregnancy                             Anesthesia Physical Anesthesia Plan  ASA: II  Anesthesia Plan: Epidural   Post-op Pain Management:    Induction:   Airway Management Planned:   Additional Equipment:   Intra-op Plan:   Post-operative Plan:   Informed Consent: I have reviewed the patients History and Physical, chart, labs and discussed the procedure including the risks, benefits and alternatives for the proposed anesthesia with the patient or authorized representative who has indicated his/her understanding and acceptance.   Dental advisory given  Plan Discussed with: Anesthesiologist  Anesthesia Plan Comments:         Anesthesia Quick Evaluation

## 2016-05-20 NOTE — Progress Notes (Signed)
svd of viable female by cousins md

## 2016-05-21 LAB — CBC
HCT: 33.5 % — ABNORMAL LOW (ref 36.0–46.0)
HEMOGLOBIN: 11.6 g/dL — AB (ref 12.0–15.0)
MCH: 28.9 pg (ref 26.0–34.0)
MCHC: 34.6 g/dL (ref 30.0–36.0)
MCV: 83.5 fL (ref 78.0–100.0)
Platelets: 261 10*3/uL (ref 150–400)
RBC: 4.01 MIL/uL (ref 3.87–5.11)
RDW: 14.5 % (ref 11.5–15.5)
WBC: 8.7 10*3/uL (ref 4.0–10.5)

## 2016-05-21 MED ORDER — OXYCODONE-ACETAMINOPHEN 5-325 MG PO TABS
1.0000 | ORAL_TABLET | ORAL | Status: DC | PRN
  Administered 2016-05-21 – 2016-05-22 (×4): 1 via ORAL
  Filled 2016-05-21 (×2): qty 1

## 2016-05-21 MED ORDER — OXYCODONE-ACETAMINOPHEN 5-325 MG PO TABS
2.0000 | ORAL_TABLET | ORAL | Status: DC | PRN
  Administered 2016-05-21: 2 via ORAL
  Filled 2016-05-21 (×2): qty 2

## 2016-05-21 NOTE — Clinical Social Work Maternal (Signed)
  CLINICAL SOCIAL WORK MATERNAL/CHILD NOTE  Patient Details  Name: Janet Gonzalez MRN: [id number] Date of Birth: Jan 29, 1981  Date:  05/21/2016  Clinical Social Worker Initiating Note:  Laurey Arrow Date/ Time Initiated:  05/21/16/1109     Child's Name:  Ledon Snare   Legal Guardian:  Mother   Need for Interpreter:  None   Date of Referral:  05/21/16     Reason for Referral:      Referral Source:  Central Nursery   Address:  [place] [address]  Phone number:  [phone]   Household Members:  Self, Minor Children   Natural Supports (not living in the home):  Extended Family, Friends, Immediate Family, Parent, Spouse/significant other   Professional Supports: Other (Comment) (referral encouaraged for [employer])   Employment: Full-time   Type of Work:     Education:  Engineer, maintenance Resources:  Multimedia programmer   Other Resources:      Cultural/Religious Considerations Which May Impact Care:  none reported  Strengths:  Ability to meet basic needs , Home prepared for child , Engineer, materials , Understanding of illness   Risk Factors/Current Problems:  Mental Health Concerns    Cognitive State:  Alert , Insightful , Goal Oriented , Linear Thinking    Mood/Affect:  Comfortable , Bright , Happy , Relaxed    CSW Assessment: CSW met with MOB to complete an assessment  for a consult for hx of anxiety.  MOB was polite, engaging, and interested in meeting with CSW.  MOB introduced her room guest as FOB Rosette Reveal).  MOB gave CSW permission to meet with MOB while FOB was present. CSW inquired about MOB's hx of anxiety.  MOB openly acknowledged her hx of anxiety, and expressed that over the past 3-4 weeks her anxiety has increased due to a lack of sleep from having to work long hours.  MOB declined resources and referrals for [place] services and stated she knows where to go if she feels she needs help. CSW educated MOB  about PPD.  CSW informed MOB of possible supports and interventions to decrease PPD and reviewed supports for MOB. CSW also encouraged MOB to seek medical attention if needed for increased signs and symptoms for PPD.  MOB was acknowledgeable about PPD, and was aware that due to prior hx of anxiety it increases her chances of perinatal mood disorders. CSW reviewed safe sleep and SIDS. MOB was knowledgeable, and asked appropriate questions.   MOB communicated that she has a bassinet for the baby, and stated that she feels to prepared to meet the infant's needs.  FOB was not engaged during the conversation with CSW, however, he was attentive and appropriate with interacting with the baby.  CSW thanked MOB for meeting with CSW.  MOB did not have any questions or concerns during this time.  CSW Plan/Description:  Patient/Family Education , No Further Intervention Required/No Barriers to Discharge    Katherin Ramey D BOYD-GILYARD, LCSW 05/21/2016, 11:12 AM

## 2016-05-21 NOTE — Lactation Note (Signed)
This note was copied from a baby's chart. Lactation Consultation Note Experienced BF mom BF her now 35 yr old for 1 1/2 yr. Stated had to wear shells to assist nipples in everting. Mom stated her nipples were inverted. Doesn't appear to be inverted. Lt. Nipple everted, sore. Rt. Nipple short shaft, flattens when compressed. Mom has LONG LARGE pendulum breast. Nipple at the end of breast. Mom requested shells to wear in bra. Gave a pair. Baby getting a bath at this time. Baby appears to have a small mouth, mom has slightly larger nipples. A latch needs to be seen to see if the correct and has good depth. Hand expression taught w/easy express colostrum. Mom stated her breast increased during pregnancy. Mom encouraged to feed baby 8-12 times/24 hours and with feeding cues. Referred to Baby and Me Book in Breastfeeding section Pg. 22-23 for position options and Proper latch demonstration.= Educated about newborn behavior, STS, I&O, supply and demand. Mom encouraged to waken baby for feeds. Mazie brochure given w/resources, support groups and [place] services. Patient Name: Girl Claudia Neugebauer [id number] Date: 05/21/2016 Reason for consult: Initial assessment   Maternal Data Has patient been taught Hand Expression?: Yes Does the patient have breastfeeding experience prior to this delivery?: Yes  Feeding Feeding Type: Breast Fed  LATCH Score/Interventions       Type of Nipple: Everted at rest and after stimulation  Comfort (Breast/Nipple): Filling, red/small blisters or bruises, mild/mod discomfort  Problem noted: Mild/Moderate discomfort Interventions (Mild/moderate discomfort): Hand massage;Hand expression (coconut oil)  Intervention(s): Support Pillows;Position options;Skin to skin;Breastfeeding basics reviewed     Lactation Tools Discussed/Used Tools: Shells Shell Type: Inverted   Consult Status Consult Status: Follow-up Date: 05/22/16 Follow-up type: In-patient    Theodoro Kalata 05/21/2016, 7:18 AM

## 2016-05-21 NOTE — Progress Notes (Signed)
UR chart review completed.  

## 2016-05-21 NOTE — Anesthesia Postprocedure Evaluation (Signed)
Anesthesia Post Note  Patient: EULANDA CANTLON  Procedure(s) Performed: * No procedures listed *  Patient location during evaluation: Mother Baby Anesthesia Type: Epidural Level of consciousness: awake, awake and alert and oriented Pain management: pain level controlled Vital Signs Assessment: post-procedure vital signs reviewed and stable Respiratory status: respiratory function stable, nonlabored ventilation and spontaneous breathing Cardiovascular status: stable Postop Assessment: no headache, no backache, no signs of nausea or vomiting, patient able to bend at knees and adequate PO intake Anesthetic complications: no     Last Vitals:  Filed Vitals:   05/21/16 0114 05/21/16 0527  BP: 102/60 95/65  Pulse: 80 88  Temp: 36.6 C 36.8 C  Resp: 18 18    Last Pain:  Filed Vitals:   05/21/16 0758  PainSc: 4    Pain Goal: Patients Stated Pain Goal: 0 (05/20/16 2326)               Ira Busbin

## 2016-05-21 NOTE — Progress Notes (Signed)
PPD 1 SVD with 1st degree repair, female NB  S:  Reports feeling well, some swelling and pain in hands and feet.                Tolerating po/ No nausea or vomiting             Bleeding is light             Pain controlled withPO meds             Up ad lib / ambulatory / voiding QS  Newborn breastfeeding, some difficulty latching, has seen lactation consult.   O:               VS: BP 95/65 mmHg  Pulse 88  Temp(Src) 98.2 F (36.8 C) (Oral)  Resp 18  Ht 5\' 5"  (1.651 m)  Wt 112.492 kg (248 lb)  BMI 41.27 kg/m2  SpO2 100%  LMP 08/20/2015  Breastfeeding? Unknown   LABS:              Recent Labs  05/19/16 0136 05/21/16 0604  WBC 7.1 8.7  HGB 12.1 11.6*  PLT 266 261               Blood type: --/--/B POS, B POS (06/25 0136)  Rubella: Immune (12/07 0000)                     I&O: Intake/Output      06/26 0701 - 06/27 0700 06/27 0701 - 06/28 0700   Urine (mL/kg/hr) 1500 (0.6)    Blood 100 (0)    Total Output 1600     Net -1600                        Physical Exam:             Alert and oriented X3  Abdomen: soft, non-tender, non-distended              Fundus: firm, non-tender, U-1  Perineum: no edema  Lochia: moderate   Extremities: +1edema, no calf pain or tenderness    A: PPD # 1   Doing well - stable status  P: Routine post partum orders  Breastfeeding assist for latch             Anticipate DC in AM  Artelia Laroche CNM, MSN, [facility] 05/21/2016, 8:40 AM

## 2016-05-22 ENCOUNTER — Encounter (HOSPITAL_COMMUNITY): Payer: Self-pay

## 2016-05-22 DIAGNOSIS — O1205 Gestational edema, complicating the puerperium: Secondary | ICD-10-CM | POA: Diagnosis not present

## 2016-05-22 HISTORY — DX: Gestational edema, complicating the puerperium: O12.05

## 2016-05-22 MED ORDER — HYDROCHLOROTHIAZIDE 12.5 MG PO TABS: 25.0000 mg | ORAL_TABLET | Freq: Every day | ORAL | Status: DC

## 2016-05-22 MED ORDER — IBUPROFEN 600 MG PO TABS: 600.0000 mg | ORAL_TABLET | Freq: Four times a day (QID) | ORAL | Status: DC

## 2016-05-22 MED ORDER — OXYCODONE-ACETAMINOPHEN 5-325 MG PO TABS
1.0000 | ORAL_TABLET | ORAL | Status: DC | PRN
Start: 2016-05-22 — End: 2016-08-09

## 2016-05-22 NOTE — Discharge Summary (Signed)
OB Discharge Summary     Patient Name: Janet Gonzalez DOB: 1981/09/30 MRN: [id number]  Date of admission: 05/19/2016 Delivering MD: Jasani Lengel   Date of discharge: 05/22/2016  Admitting diagnosis: INDUCTION Intrauterine pregnancy: [redacted]w[redacted]d     Secondary diagnosis:  Principal Problem:   Postpartum care following vaginal delivery (6/26) Active Problems:   GDM, class A1   Gestational edema affecting puerperium  Additional problems: none     Discharge diagnosis: Term Pregnancy Delivered, Class A1 GDM                                                                                                Post partum procedures:none  Augmentation: AROM, Pitocin and Cytotec  Complications: None  Hospital course:  Induction of Labor With Vaginal Delivery   35 y.o. yo G2P2002 at [redacted]w[redacted]d was admitted to the hospital 05/19/2016 for induction of labor.  Indication for induction: Class A1 GDM, multiparity.  Patient had an uncomplicated labor course as follows: cytotec for cervical ripening, pitocin and  Membrane Rupture Time/Date: 12:06 PM ,05/20/2016   Intrapartum Procedures: Episiotomy: None [1]                                         Lacerations:  1st degree [2];Perineal [11]  Patient had delivery of a Viable infant.  Information for the patient's newborn:  [place], [place] [id number]  Delivery Method: Vaginal, Spontaneous Delivery (Filed from Delivery Summary)   05/20/2016  Details of delivery can be found in separate delivery note.  Patient had a routine postpartum course. Patient is discharged home 05/22/2016.   Physical exam  Filed Vitals:   05/21/16 0114 05/21/16 0527 05/21/16 1818 05/22/16 0611  BP: 102/60 95/65 114/61 102/62  Pulse: 80 88 95 81  Temp: 97.9 F (36.6 C) 98.2 F (36.8 C) 98.6 F (37 C) 98.3 F (36.8 C)  TempSrc: Oral Oral Oral Oral  Resp: 18 18 18 18   Height:      Weight:      SpO2:    98%   General: alert, cooperative and no distress Lochia:  appropriate Uterine Fundus: firm DVT Evaluation: No evidence of DVT seen on physical exam. Negative Homan's sign. No cords or calf tenderness. Significant calf/ankle 3+ edema.  Labs: Lab Results  Component Value Date   WBC 8.7 05/21/2016   HGB 11.6* 05/21/2016   HCT 33.5* 05/21/2016   MCV 83.5 05/21/2016   PLT 261 05/21/2016   CMP Latest Ref Rng 07/18/2015  Glucose 65 - 99 mg/dL 77  BUN 7 - 25 mg/dL 10  Creatinine 0.50 - 1.10 mg/dL 0.70  Sodium 135 - 146 mmol/L 141  Potassium 3.5 - 5.3 mmol/L 4.7  Chloride 98 - 110 mmol/L 108  CO2 20 - 31 mmol/L 27  Calcium 8.6 - 10.2 mg/dL 8.6  Total Protein 6.1 - 8.1 g/dL 6.3  Total Bilirubin 0.2 - 1.2 mg/dL 0.4  Alkaline Phos 33 - 115 U/L 65  AST 10 - 30 U/L 14  ALT 6 - 29 U/L 9    Discharge instruction: per After Visit Summary and "Baby and Me Booklet".  After visit meds:    Medication List    STOP taking these medications        acetaminophen 500 MG tablet  Commonly known as:  TYLENOL     EVENING PRIMROSE OIL PO      TAKE these medications        calcium carbonate 500 MG chewable tablet  Commonly known as:  TUMS - dosed in mg elemental calcium  Chew 1 tablet by mouth daily.     Carbonyl Iron 25 MG Tabs  Take 1 tablet by mouth daily. Reported on 05/19/2016     CRANBERRY CONCENTRATE PO  Take 1 tablet by mouth daily.     EPINEPHrine 0.3 mg/0.3 mL Soaj injection  Commonly known as:  EPI-PEN  Inject 0.3 mLs (0.3 mg total) into the muscle once.     folic acid A999333 MCG tablet  Commonly known as:  FOLVITE  Take 400 mcg by mouth daily.     hydrochlorothiazide 12.5 MG tablet  Commonly known as:  HYDRODIURIL  Take 2 tablets (25 mg total) by mouth daily.     ibuprofen 600 MG tablet  Commonly known as:  ADVIL,MOTRIN  Take 1 tablet (600 mg total) by mouth every 6 (six) hours.     multivitamin-prenatal 27-0.8 MG Tabs tablet  Take 1 tablet by mouth daily at 12 noon.     oxyCODONE-acetaminophen 5-325 MG tablet  Commonly  known as:  PERCOCET/ROXICET  Take 1-2 tablets by mouth every 4 (four) hours as needed (severe paim).     ranitidine 150 MG capsule  Commonly known as:  ZANTAC  Take 150 mg by mouth daily as needed for heartburn.     VITAMIN D-3 PO  Take 1 capsule by mouth daily.        Diet: routine diet  Activity: Advance as tolerated. Pelvic rest for 6 weeks.   Outpatient follow up:6 weeks, 2hr GTT 8 weeks postpartum Follow up Appt:No future appointments. Follow up Visit:No Follow-up on file.  Postpartum contraception: Undecided  Newborn Data: Live born female on 05/22/2016 Birth Weight: 7 lb 0.2 oz (3181 g) APGAR: 9, 9  Baby Feeding: Breast Disposition:home with mother   05/22/2016 Laury Deep, Jerilynn Mages, CNM

## 2016-05-22 NOTE — Discharge Instructions (Signed)
Breast Pumping Tips °If you are breastfeeding, there may be times when you cannot feed your baby directly. Returning to work or going on a trip are common examples. Pumping allows you to store breast milk and feed it to your baby later.  °You may not get much milk when you first start to pump. Your breasts should start to make more after a few days. If you pump at the times you usually feed your baby, you may be able to keep making enough milk to feed your baby without also using formula. The more often you pump, the more milk you will produce.  °WHEN SHOULD I PUMP?  °· You can begin to pump soon after delivery. However, some experts recommend waiting about 4 weeks before giving your infant a bottle to make sure breastfeeding is going well.  °· If you plan to return to work, begin pumping a few weeks before. This will help you develop techniques that work best for you. It also lets you build up a supply of breast milk.   °· When you are with your infant, feed on demand and pump after each feeding.   °· When you are away from your infant for several hours, pump for about 15 minutes every 2-3 hours. Pump both breasts at the same time if you can.   °· If your infant has a formula feeding, make sure to pump around the same time.     °· If you drink any alcohol, wait 2 hours before pumping.   °HOW DO I PREPARE TO PUMP? °Your let-down reflex is the natural reaction to stimulation that makes your breast milk flow. It is easier to stimulate this reflex when you are relaxed. Find relaxation techniques that work for you. If you have difficulty with your let-down reflex, try these methods:  °· Smell one of your infant's blankets or an item of clothing.   °· Look at a picture or video of your infant.   °· Sit in a quiet, private space.   °· Massage the breast you plan to pump.   °· Place soothing warmth on the breast.   °· Play relaxing music.   °WHAT ARE SOME GENERAL BREAST PUMPING TIPS? °· Wash your hands before you pump. You  do not need to wash your nipples or breasts. °· There are three ways to pump. °· You can use your hand to massage and compress your breast. °· You can use a handheld manual pump. °· You can use an electric pump.   °· Make sure the suction cup (flange) on the breast pump is the right size. Place the flange directly over the nipple. If it is the wrong size or placed the wrong way, it may be painful and cause nipple damage.   °· If pumping is uncomfortable, apply a small amount of purified or modified lanolin to your nipple and areola. °· If you are using an electric pump, adjust the speed and suction power to be more comfortable. °· If pumping is painful or if you are not getting very much milk, you may need a different type of pump. A lactation consultant can help you determine what type of pump to use.   °· Keep a full water bottle near you at all times. Drinking lots of fluid helps you make more milk.  °· You can store your milk to use later. Pumped breast milk can be stored in a sealable, sterile container or plastic bag. Label all stored breast milk with the date you pumped it. °· Milk can stay out at room temperature for up to 8 hours. °·   You can store your milk in the refrigerator for up to 8 days.  You can store your milk in the freezer for 3 months. Thaw frozen milk using warm water. Do not put it in the microwave.  Do not smoke. Smoking can lower your milk supply and harm your infant. If you need help quitting, ask your health care provider to recommend a program.  [place] A LACTATION CONSULTANT?  You are having trouble pumping.  You are concerned that you are not making enough milk.  You have nipple pain, soreness, or redness.  You want to use birth control. Birth control pills may lower your milk supply. Talk to your health care provider about your options.   This information is not intended to replace advice given to you by your health care provider.  Make sure you discuss any questions you have with your health care provider.   Document Released: 05/01/2010 Document Revised: 11/16/2013 Document Reviewed: 09/03/2013 Elsevier Interactive Patient Education 2016 [employer] Breastfeeding and Mastitis Mastitis is inflammation of the breast tissue. It can occur in women who are breastfeeding. This can make breastfeeding painful. Mastitis will sometimes go away on its own. Your health care provider will help determine if treatment is needed. CAUSES Mastitis is often associated with a blocked milk (lactiferous) duct. This can happen when too much milk builds up in the breast. Causes of excess milk in the breast can include:  Poor latch-on. If your baby is not latched onto the breast properly, she or he may not empty your breast completely while breastfeeding.  Allowing too much time to pass between feedings.  Wearing a bra or other clothing that is too tight. This puts extra pressure on the lactiferous ducts so milk does not flow through them as it should. Mastitis can also be caused by a bacterial infection. Bacteria may enter the breast tissue through cuts or openings in the skin. In women who are breastfeeding, this may occur because of cracked or irritated skin. Cracks in the skin are often caused when your baby does not latch on properly to the breast. SIGNS AND SYMPTOMS  Swelling, redness, tenderness, and pain in an area of the breast.  Swelling of the glands under the arm on the same side.  Fever may or may not accompany mastitis. If an infection is allowed to progress, a collection of pus (abscess) may develop. DIAGNOSIS  Your health care provider can usually diagnose mastitis based on your symptoms and a physical exam. Tests may be done to help confirm the diagnosis. These may include:  Removal of pus from the breast by applying pressure to the area. This pus can be examined in the lab to determine which bacteria are present. If an  abscess has developed, the fluid in the abscess can be removed with a needle. This can also be used to confirm the diagnosis and determine the bacteria present. In most cases, pus will not be present.  Blood tests to determine if your body is fighting a bacterial infection.  Mammogram or ultrasound tests to rule out other problems or diseases. TREATMENT  Mastitis that occurs with breastfeeding will sometimes go away on its own. Your health care provider may choose to wait 24 hours after first seeing you to decide whether a prescription medicine is needed. If your symptoms are worse after 24 hours, your health care provider will likely prescribe an antibiotic medicine to treat the mastitis. He or she will  determine which bacteria are most likely causing the infection and will then select an appropriate antibiotic medicine. This is sometimes changed based on the results of tests performed to identify the bacteria, or if there is no response to the antibiotic medicine selected. Antibiotic medicines are usually given by mouth. You may also be given medicine for pain. HOME CARE INSTRUCTIONS  Only take over-the-counter or prescription medicines for pain, fever, or discomfort as directed by your health care provider.  If your health care provider prescribed an antibiotic medicine, take the medicine as directed. Make sure you finish it even if you start to feel better.  Do not wear a tight or underwire bra. Wear a soft, supportive bra.  Increase your fluid intake, especially if you have a fever.  Continue to empty the breast. Your health care provider can tell you whether this milk is safe for your infant or needs to be thrown out. You may be told to stop nursing until your health care provider thinks it is safe for your baby. Use a breast pump if you are advised to stop nursing.  Keep your nipples clean and dry.  Empty the first breast completely before going to the other breast. If your baby is not  emptying your breasts completely for some reason, use a breast pump to empty your breasts.  If you go back to work, pump your breasts while at work to stay in time with your nursing schedule.  Avoid allowing your breasts to become overly filled with milk (engorged). SEEK MEDICAL CARE IF:  You have pus-like discharge from the breast.  Your symptoms do not improve with the treatment prescribed by your health care provider within 2 days. SEEK IMMEDIATE MEDICAL CARE IF:  Your pain and swelling are getting worse.  You have pain that is not controlled with medicine.  You have a red line extending from the breast toward your armpit.  You have a fever or persistent symptoms for more than 2-3 days.  You have a fever and your symptoms suddenly get worse. MAKE SURE YOU:   Understand these instructions.  Will watch your condition.  Will get help right away if you are not doing well or get worse.   This information is not intended to replace advice given to you by your health care provider. Make sure you discuss any questions you have with your health care provider.   Document Released: 03/08/2005 Document Revised: 11/16/2013 Document Reviewed: 06/17/2013 Elsevier Interactive Patient Education [employer]. Breastfeeding Deciding to breastfeed is one of the best choices you can make for you and your baby. A change in hormones during pregnancy causes your breast tissue to grow and increases the number and size of your milk ducts. These hormones also allow proteins, sugars, and fats from your blood supply to make breast milk in your milk-producing glands. Hormones prevent breast milk from being released before your baby is born as well as prompt milk flow after birth. Once breastfeeding has begun, thoughts of your baby, as well as his or her sucking or crying, can stimulate the release of milk from your milk-producing glands.  BENEFITS OF BREASTFEEDING For Your Baby  Your first milk  (colostrum) helps your baby's digestive system function better.  There are antibodies in your milk that help your baby fight off infections.  Your baby has a lower incidence of asthma, allergies, and sudden infant death syndrome.  The nutrients in breast milk are better for your baby than infant formulas and  are designed uniquely for your baby's needs.  Breast milk improves your baby's brain development.  Your baby is less likely to develop other conditions, such as childhood obesity, asthma, or type 2 diabetes mellitus. For You  Breastfeeding helps to create a very special bond between you and your baby.  Breastfeeding is convenient. Breast milk is always available at the correct temperature and costs nothing.  Breastfeeding helps to burn calories and helps you lose the weight gained during pregnancy.  Breastfeeding makes your uterus contract to its prepregnancy size faster and slows bleeding (lochia) after you give birth.   Breastfeeding helps to lower your risk of developing type 2 diabetes mellitus, osteoporosis, and breast or ovarian cancer later in life. SIGNS THAT YOUR BABY IS HUNGRY Early Signs of Hunger  Increased alertness or activity.  Stretching.  Movement of the head from side to side.  Movement of the head and opening of the mouth when the corner of the mouth or cheek is stroked (rooting).  Increased sucking sounds, smacking lips, cooing, sighing, or squeaking.  Hand-to-mouth movements.  Increased sucking of fingers or hands. Late Signs of Hunger  Fussing.  Intermittent crying. Extreme Signs of Hunger Signs of extreme hunger will require calming and consoling before your baby will be able to breastfeed successfully. Do not wait for the following signs of extreme hunger to occur before you initiate breastfeeding:  Restlessness.  A loud, strong cry.  Screaming. BREASTFEEDING BASICS Breastfeeding Initiation  Find a comfortable place to sit or lie  down, with your neck and back well supported.  Place a pillow or rolled up blanket under your baby to bring him or her to the level of your breast (if you are seated). Nursing pillows are specially designed to help support your arms and your baby while you breastfeed.  Make sure that your baby's abdomen is facing your abdomen.  Gently massage your breast. With your fingertips, massage from your chest wall toward your nipple in a circular motion. This encourages milk flow. You may need to continue this action during the feeding if your milk flows slowly.  Support your breast with 4 fingers underneath and your thumb above your nipple. Make sure your fingers are well away from your nipple and your baby's mouth.  Stroke your baby's lips gently with your finger or nipple.  When your baby's mouth is open wide enough, quickly bring your baby to your breast, placing your entire nipple and as much of the colored area around your nipple (areola) as possible into your baby's mouth.  More areola should be visible above your baby's upper lip than below the lower lip.  Your baby's tongue should be between his or her lower gum and your breast.  Ensure that your baby's mouth is correctly positioned around your nipple (latched). Your baby's lips should create a seal on your breast and be turned out (everted).  It is common for your baby to suck about 2-3 minutes in order to start the flow of breast milk. Latching Teaching your baby how to latch on to your breast properly is very important. An improper latch can cause nipple pain and decreased milk supply for you and poor weight gain in your baby. Also, if your baby is not latched onto your nipple properly, he or she may swallow some air during feeding. This can make your baby fussy. Burping your baby when you switch breasts during the feeding can help to get rid of the air. However, teaching your baby  to latch on properly is still the best way to prevent  fussiness from swallowing air while breastfeeding. Signs that your baby has successfully latched on to your nipple:  Silent tugging or silent sucking, without causing you pain.  Swallowing heard between every 3-4 sucks.  Muscle movement above and in front of his or her ears while sucking. Signs that your baby has not successfully latched on to nipple:  Sucking sounds or smacking sounds from your baby while breastfeeding.  Nipple pain. If you think your baby has not latched on correctly, slip your finger into the corner of your baby's mouth to break the suction and place it between your baby's gums. Attempt breastfeeding initiation again. Signs of Successful Breastfeeding Signs from your baby:  A gradual decrease in the number of sucks or complete cessation of sucking.  Falling asleep.  Relaxation of his or her body.  Retention of a small amount of milk in his or her mouth.  Letting go of your breast by himself or herself. Signs from you:  Breasts that have increased in firmness, weight, and size 1-3 hours after feeding.  Breasts that are softer immediately after breastfeeding.  Increased milk volume, as well as a change in milk consistency and color by the fifth day of breastfeeding.  Nipples that are not sore, cracked, or bleeding. Signs That Your [place] is Getting Enough Milk  Wetting at least 3 diapers in a 24-hour period. The urine should be clear and pale yellow by age 60 days.  At least 3 stools in a 24-hour period by age 60 days. The stool should be soft and yellow.  At least 3 stools in a 24-hour period by age 31 days. The stool should be seedy and yellow.  No loss of weight greater than 10% of birth weight during the first 80 days of age.  Average weight gain of 4-7 ounces (113-198 g) per week after age 34 days.  Consistent daily weight gain by age 348 days, without weight loss after the age of 2 weeks. After a feeding, your baby may spit up a small amount. This is  common. BREASTFEEDING FREQUENCY AND DURATION Frequent feeding will help you make more milk and can prevent sore nipples and breast engorgement. Breastfeed when you feel the need to reduce the fullness of your breasts or when your baby shows signs of hunger. This is called "breastfeeding on demand." Avoid introducing a pacifier to your baby while you are working to establish breastfeeding (the first 4-6 weeks after your baby is born). After this time you may choose to use a pacifier. Research has shown that pacifier use during the first year of a baby's life decreases the risk of sudden infant death syndrome (SIDS). Allow your baby to feed on each breast as long as he or she wants. Breastfeed until your baby is finished feeding. When your baby unlatches or falls asleep while feeding from the first breast, offer the second breast. Because newborns are often sleepy in the first few weeks of life, you may need to awaken your baby to get him or her to feed. Breastfeeding times will vary from baby to baby. However, the following rules can serve as a guide to help you ensure that your baby is properly fed:  Newborns (babies 33 weeks of age or younger) may breastfeed every 1-3 hours.  Newborns should not go longer than 3 hours during the day or 5 hours during the night without breastfeeding.  You should breastfeed your baby  a minimum of 8 times in a 24-hour period until you begin to introduce solid foods to your baby at around 53 months of age. BREAST MILK PUMPING Pumping and storing breast milk allows you to ensure that your baby is exclusively fed your breast milk, even at times when you are unable to breastfeed. This is especially important if you are going back to work while you are still breastfeeding or when you are not able to be present during feedings. Your lactation consultant can give you guidelines on how long it is safe to store breast milk. A breast pump is a machine that allows you to pump milk  from your breast into a sterile bottle. The pumped breast milk can then be stored in a refrigerator or freezer. Some breast pumps are operated by hand, while others use electricity. Ask your lactation consultant which type will work best for you. Breast pumps can be purchased, but some hospitals and breastfeeding support groups lease breast pumps on a monthly basis. A lactation consultant can teach you how to hand express breast milk, if you prefer not to use a pump. CARING FOR YOUR BREASTS WHILE YOU BREASTFEED Nipples can become dry, cracked, and sore while breastfeeding. The following recommendations can help keep your breasts moisturized and healthy:  Avoid using soap on your nipples.  Wear a supportive bra. Although not required, special nursing bras and tank tops are designed to allow access to your breasts for breastfeeding without taking off your entire bra or top. Avoid wearing underwire-style bras or extremely tight bras.  Air dry your nipples for 3-94minutes after each feeding.  Use only cotton bra pads to absorb leaked breast milk. Leaking of breast milk between feedings is normal.  Use lanolin on your nipples after breastfeeding. Lanolin helps to maintain your skin's normal moisture barrier. If you use pure lanolin, you do not need to wash it off before feeding your baby again. Pure lanolin is not toxic to your baby. You may also hand express a few drops of breast milk and gently massage that milk into your nipples and allow the milk to air dry. In the first few weeks after giving birth, some women experience extremely full breasts (engorgement). Engorgement can make your breasts feel heavy, warm, and tender to the touch. Engorgement peaks within 3-5 days after you give birth. The following recommendations can help ease engorgement:  Completely empty your breasts while breastfeeding or pumping. You may want to start by applying warm, moist heat (in the shower or with warm water-soaked hand  towels) just before feeding or pumping. This increases circulation and helps the milk flow. If your baby does not completely empty your breasts while breastfeeding, pump any extra milk after he or she is finished.  Wear a snug bra (nursing or regular) or tank top for 1-2 days to signal your body to slightly decrease milk production.  Apply ice packs to your breasts, unless this is too uncomfortable for you.  Make sure that your baby is latched on and positioned properly while breastfeeding. If engorgement persists after 48 hours of following these recommendations, contact your health care provider or a Science writer. OVERALL HEALTH CARE RECOMMENDATIONS WHILE BREASTFEEDING  Eat healthy foods. Alternate between meals and snacks, eating 3 of each per day. Because what you eat affects your breast milk, some of the foods may make your baby more irritable than usual. Avoid eating these foods if you are sure that they are negatively affecting your baby.  Drink  milk, fruit juice, and water to satisfy your thirst (about 10 glasses a day).  Rest often, relax, and continue to take your prenatal vitamins to prevent fatigue, stress, and anemia.  Continue breast self-awareness checks.  Avoid chewing and smoking tobacco. Chemicals from cigarettes that pass into breast milk and exposure to secondhand smoke may harm your baby.  Avoid alcohol and drug use, including marijuana. Some medicines that may be harmful to your baby can pass through breast milk. It is important to ask your health care provider before taking any medicine, including all over-the-counter and prescription medicine as well as vitamin and herbal supplements. It is possible to become pregnant while breastfeeding. If birth control is desired, ask your health care provider about options that will be safe for your baby. SEEK MEDICAL CARE IF:  You feel like you want to stop breastfeeding or have become frustrated with  breastfeeding.  You have painful breasts or nipples.  Your nipples are cracked or bleeding.  Your breasts are red, tender, or warm.  You have a swollen area on either breast.  You have a fever or chills.  You have nausea or vomiting.  You have drainage other than breast milk from your nipples.  Your breasts do not become full before feedings by the fifth day after you give birth.  You feel sad and depressed.  Your baby is too sleepy to eat well.  Your baby is having trouble sleeping.   Your baby is wetting less than 3 diapers in a 24-hour period.  Your baby has less than 3 stools in a 24-hour period.  Your baby's skin or the white part of his or her eyes becomes yellow.   Your baby is not gaining weight by 24 days of age. SEEK IMMEDIATE MEDICAL CARE IF:  Your baby is overly tired (lethargic) and does not want to wake up and feed.  Your baby develops an unexplained fever.   This information is not intended to replace advice given to you by your health care provider. Make sure you discuss any questions you have with your health care provider.   Document Released: 11/11/2005 Document Revised: 08/02/2015 Document Reviewed: 05/05/2013 Elsevier Interactive Patient Education [employer].

## 2016-05-22 NOTE — Progress Notes (Signed)
PPD #2 SVD with 1st degree repair  S:  Reports feeling well, some swelling and pain in hands and feet.                Tolerating po/ No nausea or vomiting             Bleeding is light             Pain controlled withibuprofen (OTC) and narcotic analgesics including oxycodone/acetaminophen (Percocet, Tylox)             Up ad lib / ambulatory / voiding QS  Newborn breastfeeding, some difficulty latching, has seen lactation consult.   O:               VS: BP 102/62 mmHg  Pulse 81  Temp(Src) 98.3 F (36.8 C) (Oral)  Resp 18  Ht 5\' 5"  (1.651 m)  Wt 112.492 kg (248 lb)  BMI 41.27 kg/m2  SpO2 98%  LMP 08/20/2015  Breastfeeding   LABS:               Recent Labs  05/21/16 0604  WBC 8.7  HGB 11.6*  PLT 261               Blood type: B POS (06/25 0136)  Rubella: Immune (12/07 0000)                                  Physical Exam:             Alert and oriented x 3  Abdomen: soft, non-tender, non-distended              Fundus: firm, non-tender, U-1  Perineum: no edema  Lochia: moderate   Extremities: +2 pedal, ankle and calf edema, no calf pain or tenderness    A: PPD # 2 / [id number] / S/P SVD with 1st degree laceration  Gestational edema, postpartum  Doing well - stable status  P: Routine post partum orders  Start HCTZ 25 mg x 7 days  Advised to increase H2O intake / elevate BLE             DC home today  Graceann Congress MSN, CNM 05/22/2016, 8:52 AM

## 2016-05-26 ENCOUNTER — Inpatient Hospital Stay (HOSPITAL_COMMUNITY): Admission: AD | Admit: 2016-05-26 | Payer: 59 | Source: Ambulatory Visit | Admitting: Obstetrics and Gynecology

## 2016-06-19 ENCOUNTER — Ambulatory Visit: Payer: 59 | Admitting: Family Medicine

## 2016-06-24 IMAGING — DX DG THORACIC SPINE 3V
3 series · 3 of 3 positions shown · non-contrast
Comparison: PA and lateral chest x-ray February 08, 2014

CLINICAL DATA: One-month history of back pain between the shoulder
blades without known injury

EXAM:
THORACIC SPINE - 3 VIEWS

[t-spine ap]
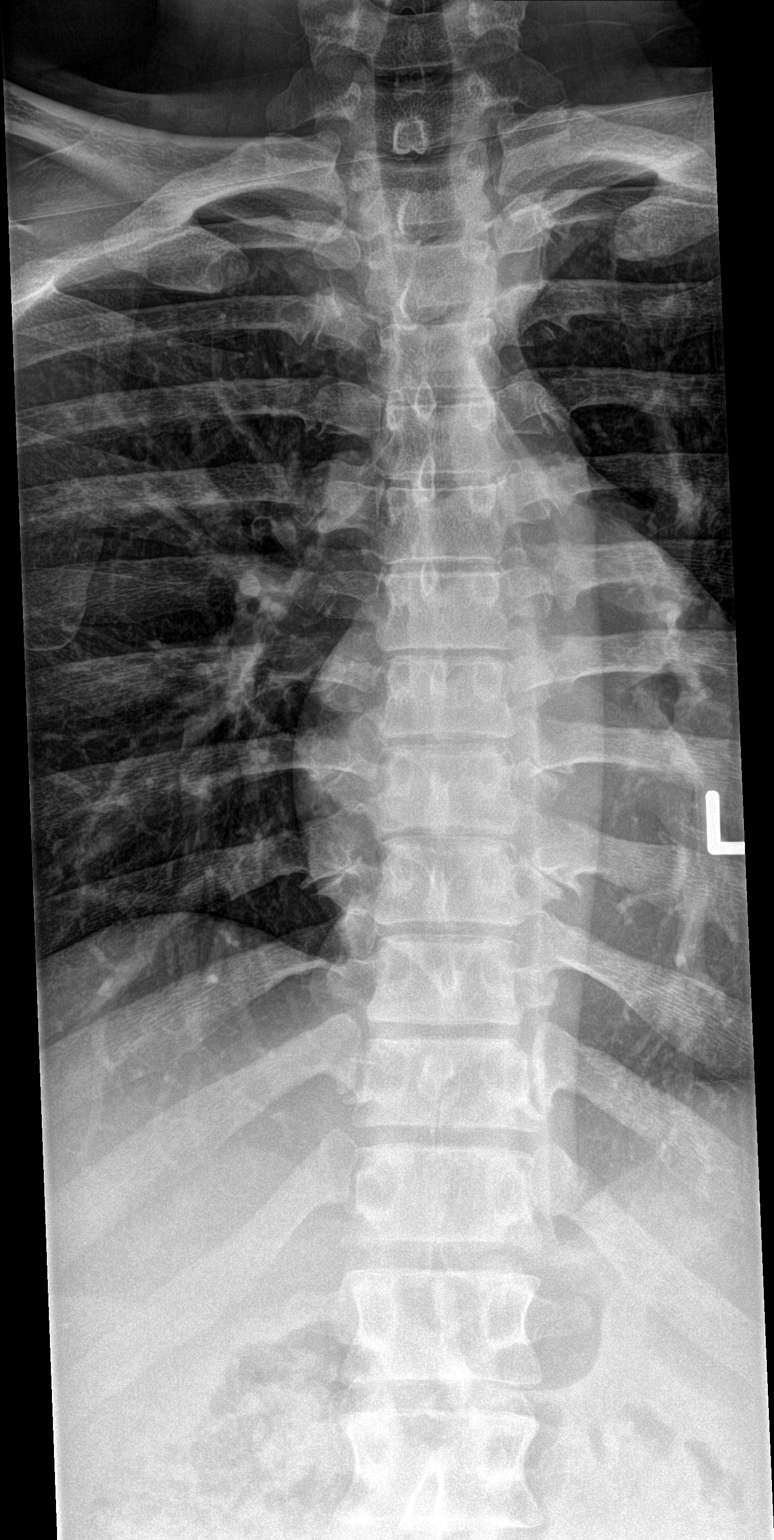

[t-spine lat]
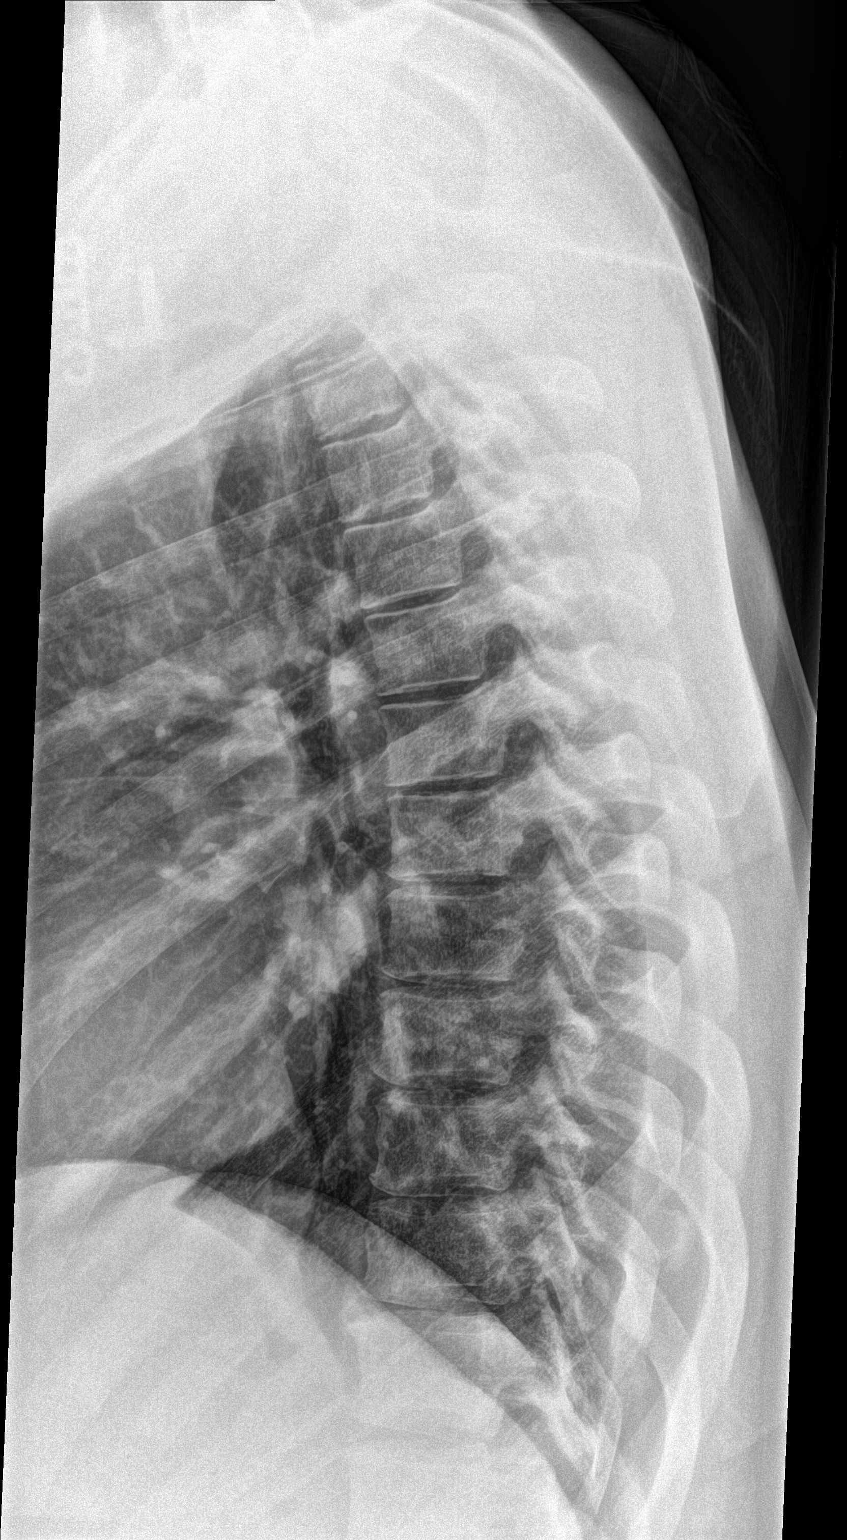

[t-spine swimmers]
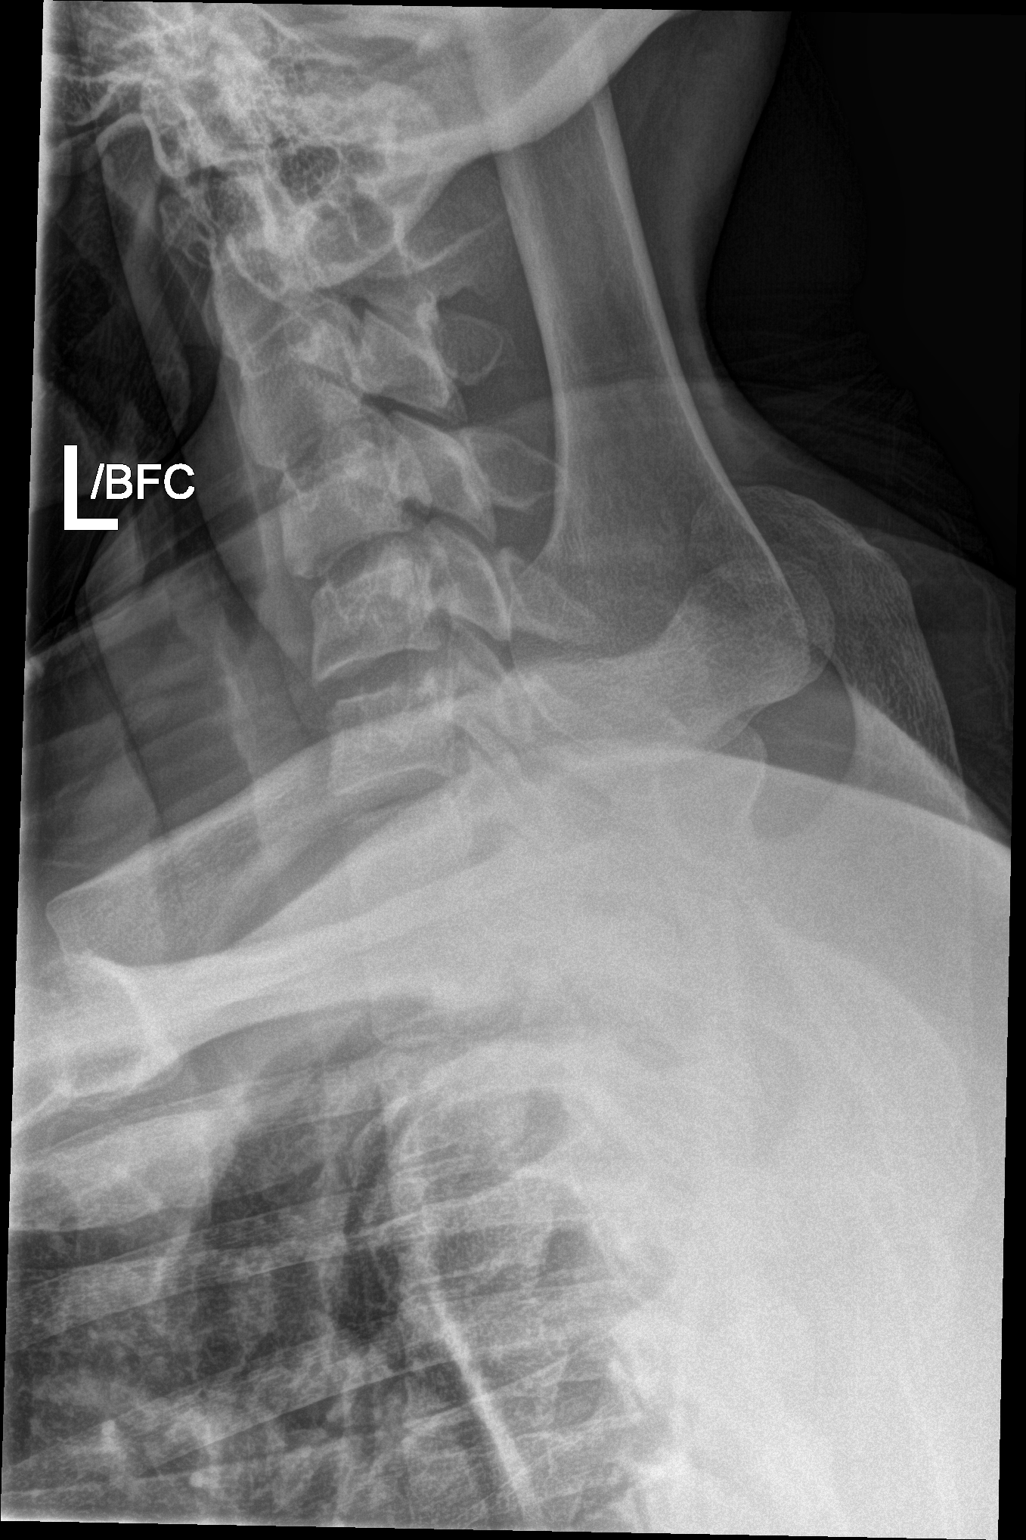

[3 of 3 positions shown; findings below may reference images not displayed]

FINDINGS: The thoracic vertebral bodies are preserved in height. The pedicles
are intact. There are no abnormal paravertebral soft tissue
densities. The intervertebral disc space heights are reasonably
well-maintained. The cervicothoracic junction is unremarkable.
IMPRESSION: There is no acute or significant chronic bony abnormality of the
thoracic spine.

## 2016-07-04 IMAGING — MR MR CERVICAL SPINE W/O CM
4 of 5 series · 19 of 48 positions shown · non-contrast
Comparison: 09/03/2010 cervical spine MR. Brain MR performed same
date is dictated separately.

CLINICAL DATA: 34-year-old female with neck pain and headaches with
numbness in both upper extremities for the past 4 months. No injury
or trauma. Initial encounter.

EXAM:
MRI CERVICAL SPINE WITHOUT CONTRAST
TECHNIQUE: Multiplanar, multisequence MR imaging of the cervical spine was
performed. No intravenous contrast was administered.

[Series 3: T1 · sagittal · 3.0mm · 0.41mm/px · 3 of 12 slices shown]
[im 3/12]
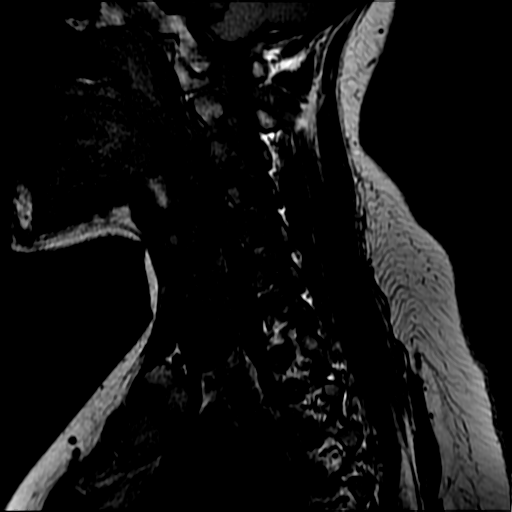
[im 7/12]
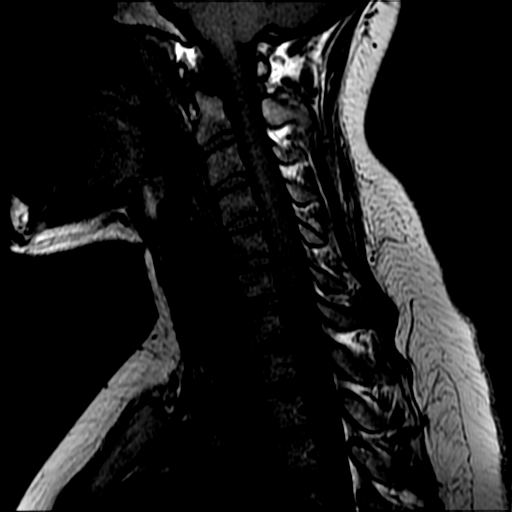
[im 12/12]
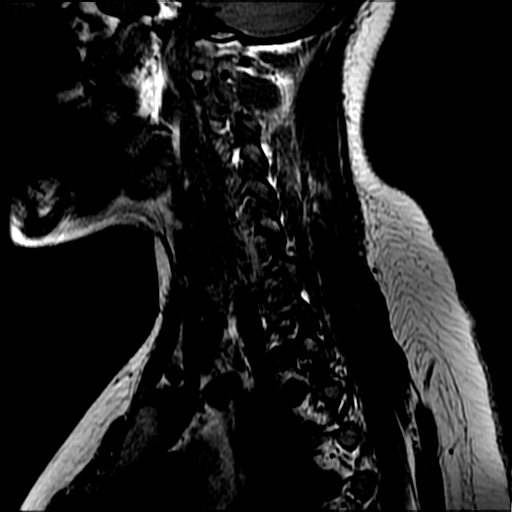

[Series 4: sag ir · sagittal · 3.0mm · 0.41mm/px · 3 of 12 slices shown]
[im 3/12]
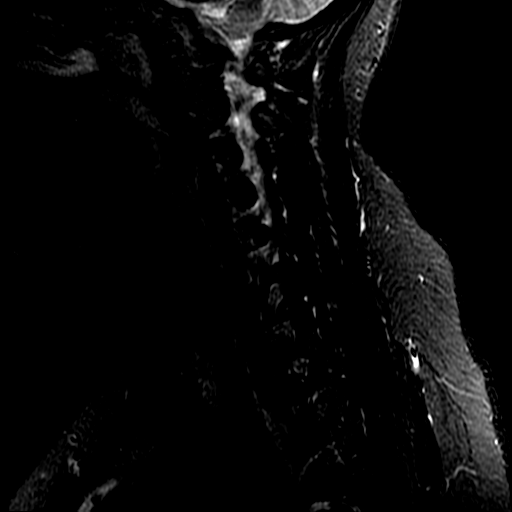
[im 7/12]
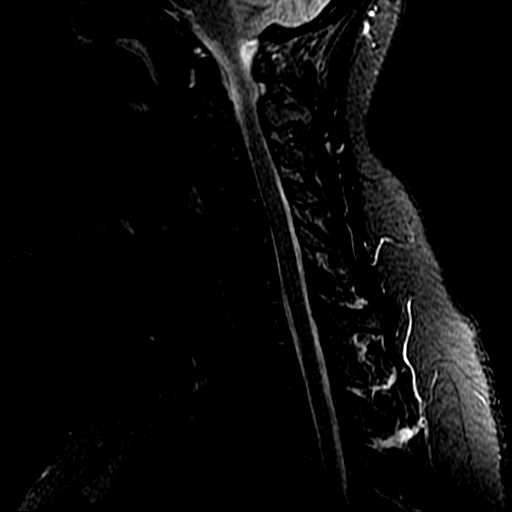
[im 12/12]
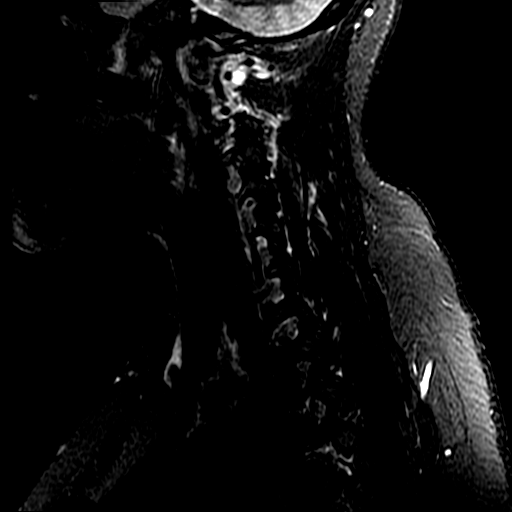

[Series 6: T2 post-contrast · sagittal · 3.0mm · 0.41mm/px · 6 of 12 slices shown]
[im 1/12]
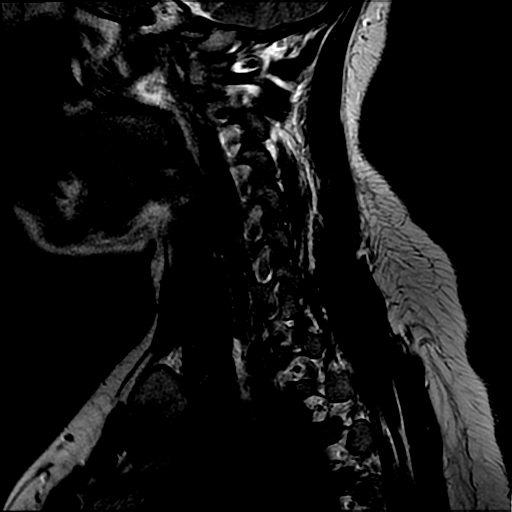
[im 3/12]
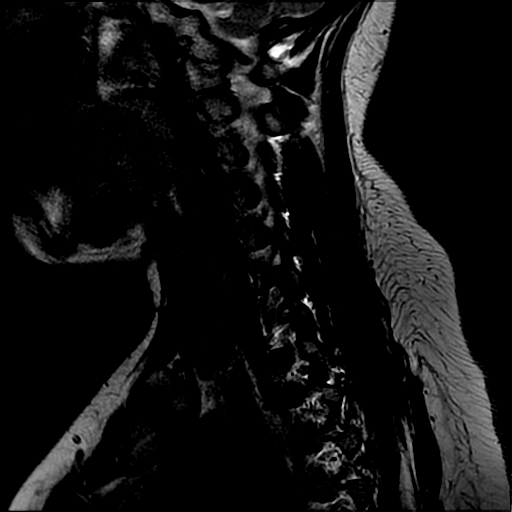
[im 5/12]
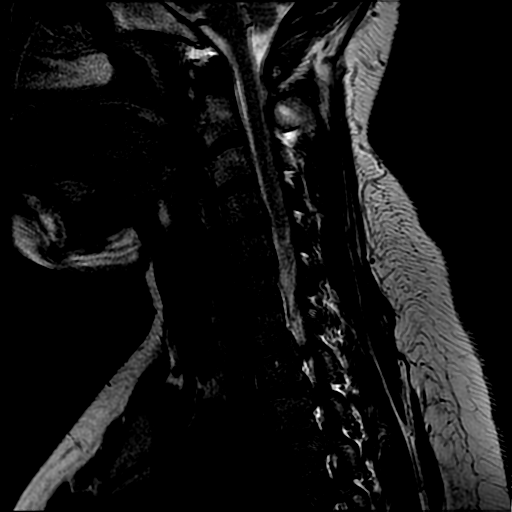
[im 7/12]
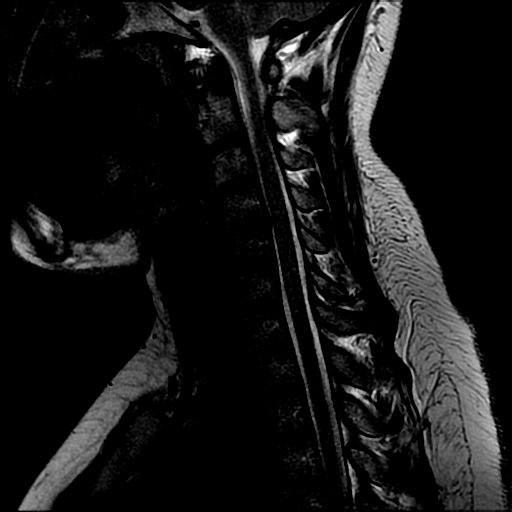
[im 9/12]
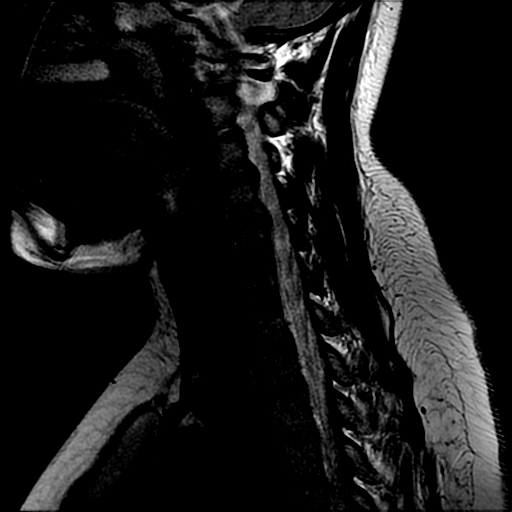
[im 12/12]
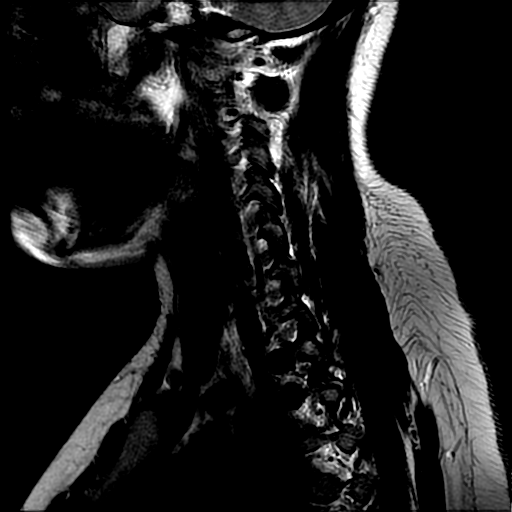

[Series 7: T2 · axial · 3.1mm · 0.35mm/px · z∈[-210,-126]mm · 7 of 29 slices shown]
[im 1/29]
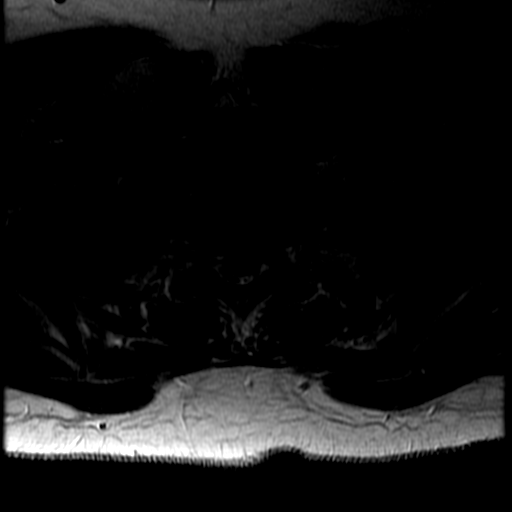
[im 5/29]
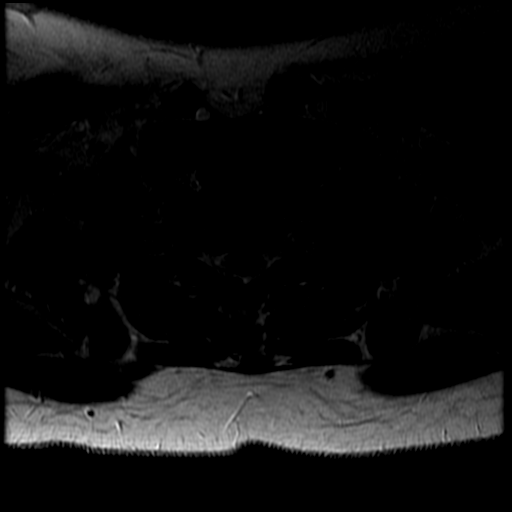
[im 9/29]
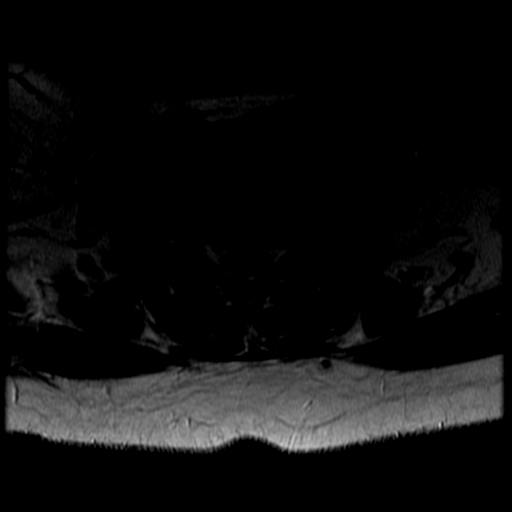
[im 13/29]
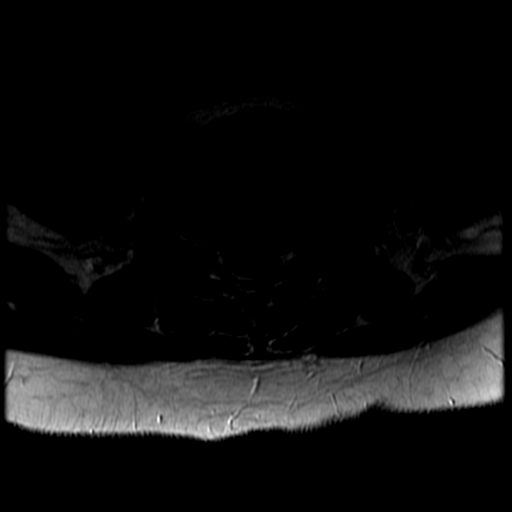
[im 15/29]
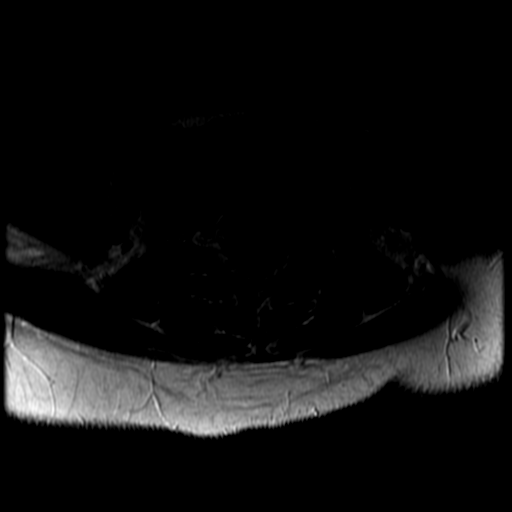
[im 17/29]
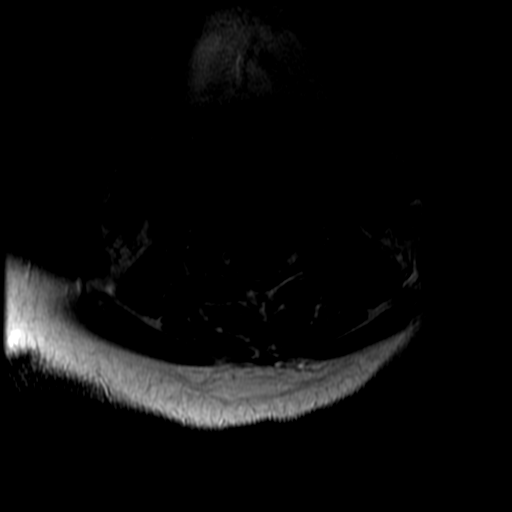
[im 25/29]
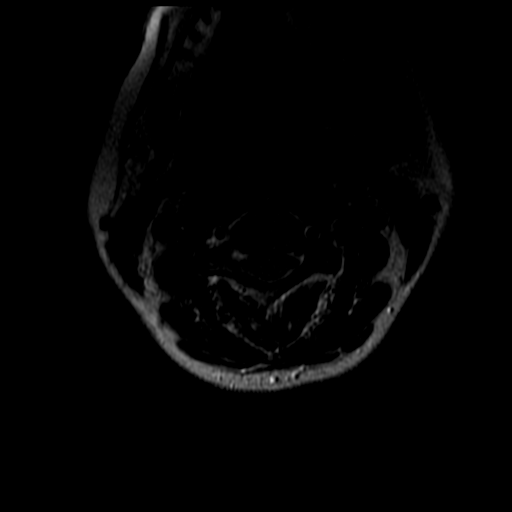

[19 of 48 positions shown; findings below may reference images not displayed]

FINDINGS: Exam is slightly motion degraded.

Cervical medullary junction unremarkable. Cervical cord without
focal signal abnormality.

Patient has had prior partial thyroidectomy on the right. Left lobe
is minimally heterogeneous.

Baseline mild congenital narrowing of the cervical spine.
Additionally:

C2-3:  Negative.

C3-4:  Right uncinate hypertrophy.  Mild right foraminal narrowing.

C4-5: Minimal bulge with baseline ventral thecal sac narrowing.
Superimposed right posterior lateral focal protrusion and spur with
crowding of the right C5 nerve root and mild right foraminal
narrowing.

C5-6:  Negative.

C6-7:  Negative.

C7-T1:  Negative.

T1-2:  Negative.

T2-3:  Negative.

T3-4: Minimal bulge.
IMPRESSION: When compared to prior examination, mild progressive degenerative
changes on the right at the C4-5 level as detailed above.

## 2016-07-25 ENCOUNTER — Encounter: Payer: 59 | Attending: Obstetrics and Gynecology | Admitting: Dietician

## 2016-07-25 DIAGNOSIS — E669 Obesity, unspecified: Secondary | ICD-10-CM

## 2016-07-25 DIAGNOSIS — O99810 Abnormal glucose complicating pregnancy: Secondary | ICD-10-CM | POA: Insufficient documentation

## 2016-07-25 DIAGNOSIS — Z713 Dietary counseling and surveillance: Secondary | ICD-10-CM | POA: Diagnosis not present

## 2016-07-25 NOTE — Progress Notes (Signed)
Medical Nutrition Therapy:  Appt start time: 1410 end time:  1525.   Assessment:  Primary concerns today: Janet Gonzalez is here today since she would like to lose her baby weight postpartum. Had gestational diabetes for this pregnancy. Will have an OGTT next week so unsure if she is having issues with blood sugar. Her baby is 71 weeks old and she is breastfeeding. Has trouble losing weight while breastfeeding. Does not want diet to impact breast milk. Gained 100 lbs with her first pregnancy and lost all the weight. Had some weight fluctuation between pregnancies. Gained 30-40 lbs with this pregnancy (had gained about 30 lbs before the pregnancy).   When she lost/maintained weight before did a lot of meal prepping, planning out menus, and worked out a lot. Was avoiding processed foods. Was having a lot supplements which she can't do while breastfeeding.   Baby is reacting to her drinking milk or having whey protein. Still eats cheese. Might be able to eat yogurt or cottage cheese.   Lives with her daughter, her mom, her baby, and her baby's father is in the house a lot. All the adults do the food shopping and meal preparation. Might miss or skip some meals, but does a lot of snacking. She is on maternity and will be going back on 9/18. Started back to working out this week. Had been walking a couple of times.    Feels like she is always hungry.  Feels like her downfalls: eating out 1-2 x day and fried foods  Would like to lose 60-100 lbs.   Preferred Learning Style:   No preference indicated   Learning Readiness:   Ready  MEDICATIONS: see list    DIETARY INTAKE:  Unstructured meals  Avoided foods include: milk, ice cream, whey protein (baby breaks out)   24-hr recall:  B ( AM): cereal, oatmeal, eggs sometime with veggie or cheese, protein bar, fruit, almond milk, Kashi waffle with peanut butter   Snk ( AM): snacks L ( PM): snacks, leftovers, [employer] salad bar Snk ( PM): snacks  D (  PM): fresh markert, chick fil a, [employer] food, [place] Snk ( PM): sweets, popcorn, fruit, nuts, chicken salad  Beverages: water  Usual physical activity: plan to go to the gym for 5 days (cardio and weights)  Estimated energy needs: 2000 calories 225 g carbohydrates 150 g protein 56 g fat  Progress Towards Goal(s):  In progress.   Nutritional Diagnosis:  [place]-3.3 Overweight/obesity As related to unstructured meals.  As evidenced by BMI of 39.8.    Intervention:  Nutrition counseling provided. Plan: Aim to eat 3 meals per day and 3 snacks if you are hungry.  Ask mom to help with meal prepping. (soups, chili, stews) Get structure back in place for meal.  Portion out snacks.  Make sure that you are hungry when you eat snacks. Try to eat meals/snacks at table when possible.  Try EAS protein shakes (soy based) or other vegan protein shakes.  Try keep carbohydrates to around (+/- 15) 45 grams per meal and 15 grams per snack. Try to fill half of your plate with vegetable at lunch and dinner. Try non-fat [place] yogurt instead of sour cream Low fat cottage cheese - protein option.  Teaching Method Utilized:  Visual Auditory Hands on  Handouts given during visit include:  Meal card  Barriers to learning/adherence to lifestyle change: none  Demonstrated degree of understanding via:  Teach Back   Monitoring/Evaluation:  Dietary intake, exercise, and  body weight prn.

## 2016-07-25 NOTE — Patient Instructions (Addendum)
Aim to eat 3 meals per day and 3 snacks if you are hungry.  Ask mom to help with meal prepping. (soups, chili, stews) Get structure back in place for meal.  Portion out snacks.  Make sure that you are hungry when you eat snacks. Try to eat meals/snacks at table when possible.  Try EAS protein shakes (soy based) or other vegan protein shakes.  Try keep carbohydrates to around (+/- 15) 45 grams per meal and 15 grams per snack. Try to fill half of your plate with vegetable at lunch and dinner. Try non-fat [place] yogurt instead of sour cream Low fat cottage cheese - protein option.

## 2016-08-05 ENCOUNTER — Encounter: Payer: Self-pay | Admitting: Dietician

## 2016-08-09 ENCOUNTER — Encounter: Payer: Self-pay | Admitting: Family Medicine

## 2016-08-09 ENCOUNTER — Ambulatory Visit (INDEPENDENT_AMBULATORY_CARE_PROVIDER_SITE_OTHER): Payer: 59 | Admitting: Family Medicine

## 2016-08-09 VITALS — BP 110/80 | HR 78 | Resp 16 | Ht 65.0 in | Wt 243.0 lb

## 2016-08-09 DIAGNOSIS — G5601 Carpal tunnel syndrome, right upper limb: Secondary | ICD-10-CM | POA: Diagnosis not present

## 2016-08-09 DIAGNOSIS — G5600 Carpal tunnel syndrome, unspecified upper limb: Secondary | ICD-10-CM | POA: Insufficient documentation

## 2016-08-09 DIAGNOSIS — D539 Nutritional anemia, unspecified: Secondary | ICD-10-CM | POA: Diagnosis not present

## 2016-08-09 DIAGNOSIS — E66811 Obesity, class 1: Secondary | ICD-10-CM

## 2016-08-09 DIAGNOSIS — Z139 Encounter for screening, unspecified: Secondary | ICD-10-CM

## 2016-08-09 DIAGNOSIS — E669 Obesity, unspecified: Secondary | ICD-10-CM

## 2016-08-09 DIAGNOSIS — M545 Low back pain, unspecified: Secondary | ICD-10-CM

## 2016-08-09 DIAGNOSIS — Z23 Encounter for immunization: Secondary | ICD-10-CM | POA: Diagnosis not present

## 2016-08-09 DIAGNOSIS — M5440 Lumbago with sciatica, unspecified side: Secondary | ICD-10-CM

## 2016-08-09 NOTE — Assessment & Plan Note (Signed)
Recurrent intermittent flares with menses, anti inflammatory use as needed

## 2016-08-09 NOTE — Assessment & Plan Note (Signed)
Deteriorated. Patient re-educated about  the importance of commitment to a  minimum of 150 minutes of exercise per week.  The importance of healthy food choices with portion control discussed. Encouraged to start a food diary, count calories and to consider  joining a support group. Sample diet sheets offered. Goals set by the patient for the next several months.   Weight /BMI 08/09/2016 07/25/2016 05/19/2016  WEIGHT 243 lb 239 lb 248 lb  HEIGHT 5\' 5"  5\' 5"  5\' 5"   BMI 40.44 kg/m2 39.77 kg/m2 41.27 kg/m2

## 2016-08-09 NOTE — Assessment & Plan Note (Addendum)
Worsening pain and reduced function x1 year in right hand with thenar wasting and positive Tinel's, ortho eval

## 2016-08-09 NOTE — Progress Notes (Signed)
   Janet Gonzalez     MRN: [id number]      DOB: Jan 08, 1981   HPI Janet Gonzalez is here for follow up and re-evaluation of chronic medical conditions, medication management and review of any available recent lab and radiology data.  Preventive health is updated, specifically  Cancer screening and Immunization.   Has delivered a healthy baby girl 3 months ago, states she developed gestational diabetes. C/o pain , tingling and weakness of grip in right hand started in pregnancy and is worsening. Has skin tag under right eye and is already referred for this Plans to work on regular exercise and dietary change to improve health C/o intermittent sciatic pain on right side , generally perimensrual  ROS Denies recent fever or chills. Denies sinus pressure, nasal congestion, ear pain or sore throat. Denies chest congestion, productive cough or wheezing. Denies chest pains, palpitations and leg swelling Denies abdominal pain, nausea, vomiting,diarrhea or constipation.   Denies dysuria, frequency, hesitancy or incontinence. . Denies headaches, seizures, numbness, or tingling. Denies depression, anxiety or insomnia. Denies skin break down or rash.   PE  BP 110/80   Pulse 78   Resp 16   Ht 5\' 5"  (1.651 m)   Wt 243 lb (110.2 kg)   SpO2 98%   BMI 40.44 kg/m   Patient alert and oriented and in no cardiopulmonary distress.  HEENT: No facial asymmetry, EOMI,   oropharynx pink and moist.  Neck supple no JVD, no mass.  Chest: Clear to auscultation bilaterally.  CVS: S1, S2 no murmurs, no S3.Regular rate.  ABD: Soft non tender.   Ext: No edema  MS: Adequate ROM spine, shoulders, hips and knees. Right thenar wasting and positive Tinel's Skin: Intact, no ulcerations or rash noted.skin tag under right eye Psych: Good eye contact, normal affect. Memory intact not anxious or depressed appearing.  CNS: CN 2-12 intact, power,  normal throughout.no focal deficits noted.   Assessment &  Plan  Carpal tunnel syndrome Worsening pain and reduced function x1 year in right hand with thenar wasting and positive Tinel's, ortho eval  Obesity (BMI 30.0-34.9) Deteriorated. Patient re-educated about  the importance of commitment to a  minimum of 150 minutes of exercise per week.  The importance of healthy food choices with portion control discussed. Encouraged to start a food diary, count calories and to consider  joining a support group. Sample diet sheets offered. Goals set by the patient for the next several months.   Weight /BMI 08/09/2016 07/25/2016 05/19/2016  WEIGHT 243 lb 239 lb 248 lb  HEIGHT 5\' 5"  5\' 5"  5\' 5"   BMI 40.44 kg/m2 39.77 kg/m2 41.27 kg/m2      Low back pain with radiation Recurrent intermittent flares with menses, anti inflammatory use as needed

## 2016-08-09 NOTE — Patient Instructions (Signed)
Physical exam in December, call if you need me before  Flu vaccine today  Fasting labs 1 week before physica  You are referred re carpal tunnel right  Please work on good  health habits so that your health will improve. 1. Commitment to daily physical activity for 30 to 60  minutes, if you are able to do this.  2. Commitment to wise food choices. Aim for half of your  food intake to be vegetable and fruit, one quarter starchy foods, and one quarter protein. Try to eat on a regular schedule  3 meals per day, snacking between meals should be limited to vegetables or fruits or small portions of nuts. 64 ounces of water per day is generally recommended, unless you have specific health conditions, like heart failure or kidney failure where you will need to limit fluid intake.  3. Commitment to sufficient and a  good quality of physical and mental rest daily, generally between 6 to 8 hours per day.  WITH PERSISTANCE AND PERSEVERANCE, THE IMPOSSIBLE , BECOMES THE NORM! Thank you  for choosing [facility]. We consider it a privelige to serve you.  Delivering excellent health care in a caring and  compassionate way is our goal.  Partnering with you,  so that together we can achieve this goal is our strategy.

## 2016-08-23 ENCOUNTER — Ambulatory Visit: Payer: 59 | Admitting: Family Medicine

## 2016-10-29 ENCOUNTER — Telehealth: Payer: Self-pay | Admitting: Family Medicine

## 2016-10-29 NOTE — Telephone Encounter (Signed)
Pt wants a referral to ortho for her knee, she wants the same ortho - but didn't say who it was

## 2016-10-30 ENCOUNTER — Encounter: Payer: Self-pay | Admitting: Family Medicine

## 2016-10-30 ENCOUNTER — Ambulatory Visit (INDEPENDENT_AMBULATORY_CARE_PROVIDER_SITE_OTHER): Payer: 59 | Admitting: Family Medicine

## 2016-10-30 ENCOUNTER — Ambulatory Visit (HOSPITAL_COMMUNITY)
Admission: RE | Admit: 2016-10-30 | Discharge: 2016-10-30 | Disposition: A | Discharge status: Home / Self Care | Payer: 59 | Source: Ambulatory Visit | Attending: Family Medicine | Admitting: Family Medicine

## 2016-10-30 VITALS — BP 126/80 | HR 90 | Resp 18 | Ht 65.0 in | Wt 254.0 lb

## 2016-10-30 DIAGNOSIS — M25562 Pain in left knee: Secondary | ICD-10-CM

## 2016-10-30 DIAGNOSIS — Z13220 Encounter for screening for lipoid disorders: Secondary | ICD-10-CM | POA: Diagnosis not present

## 2016-10-30 DIAGNOSIS — E669 Obesity, unspecified: Secondary | ICD-10-CM | POA: Diagnosis not present

## 2016-10-30 DIAGNOSIS — E559 Vitamin D deficiency, unspecified: Secondary | ICD-10-CM | POA: Diagnosis not present

## 2016-10-30 MED ORDER — IBUPROFEN 800 MG PO TABS: 800.0000 mg | ORAL_TABLET | Freq: Three times a day (TID) | ORAL | 0 refills | Status: DC

## 2016-10-30 NOTE — Assessment & Plan Note (Signed)
Onset pain and difficulty bending the knee, x ray and short course of anti inflammatories. Pt to call if worsens or does not improve will refer to ortho

## 2016-10-30 NOTE — Telephone Encounter (Signed)
shes never been seen here for the left knee popping out of place, she is coming in the office this morning at 10:45 to be seen/referred

## 2016-10-30 NOTE — Assessment & Plan Note (Signed)
Deteriorated. Patient re-educated about  the importance of commitment to a  minimum of 150 minutes of exercise per week.  The importance of healthy food choices with portion control discussed. Encouraged to start a food diary, count calories and to consider  joining a support group. Sample diet sheets offered. Goals set by the patient for the next several months.   Weight /BMI 10/30/2016 08/09/2016 07/25/2016  WEIGHT 254 lb 243 lb 239 lb  HEIGHT 5\' 5"  5\' 5"  5\' 5"   BMI 42.27 kg/m2 40.44 kg/m2 39.77 kg/m2

## 2016-10-30 NOTE — Patient Instructions (Signed)
Annul physical exam last week in December, if possible  X ray of left knee today and ibuprofen prescribed Call in 5 days if still disabled for referral to ortho   Fasting cbc, lipid, cmp, hBa1C, TSH and vit D asap   Info re bariatric surgery and process is provided

## 2016-10-30 NOTE — Progress Notes (Signed)
   Janet Gonzalez     MRN: [id number]      DOB: 1981/06/25   HPI [name] is here stating that at 2 am yesterday morning when she tried to pull her body backward up in bd she felt acute left knee pain with a pop, was stuck in the position unable to moke knee for approx 30 mins states her mother came over annd manipulated the knee , seeming to get it back in place before she was able to move the joint, pop heard with that manipulation Since then knee noted  To b swollen with pain with bending , limping keeping knee straight when she walks since feels as if it will dislocate  Wants referral to  Bariatric surgery, hungry all the time with weight gain ROS Denies recent fever or chills. Denies sinus pressure, nasal congestion, ear pain or sore throat. Denies chest congestion, productive cough or wheezing. Denies chest pains, palpitations and leg swelling Denies abdominal pain, nausea, vomiting,diarDenies headaches, seizures, numbness, or tingling. Denies depression, anxiety or insomnia. Denies skin break down or rash.   PE  BP 126/80   Pulse 90   Resp 18   Ht 5\' 5"  (1.651 m)   Wt 254 lb (115.2 kg)   SpO2 97%   BMI 42.27 kg/m   Patient alert and oriented and in no cardiopulmonary distress.  HEENT: No facial asymmetry, EOMI,   oropharynx pink and moist.  Neck supple no JVD, no mass.  Chest: Clear to auscultation bilaterally.  CVS: S1, S2 no murmurs, no S3.Regular rate.  ABD: Soft non tender.   Ext: No edema  MS: Adequate ROM spine, shoulders, hips and reduced in left  knee.  Skin: Intact, no ulcerations or rash noted.  Psych: Good eye contact, normal affect. Memory intact not anxious or depressed appearing.  CNS: CN 2-12 intact, power,  normal throughout.no focal deficits noted.   Assessment & Plan  Knee pain, left Onset pain and difficulty bending the knee, x ray and short course of anti inflammatories. Pt to call if worsens or does not improve will refer to  ortho  Obesity (BMI 30.0-34.9) Deteriorated. Patient re-educated about  the importance of commitment to a  minimum of 150 minutes of exercise per week.  The importance of healthy food choices with portion control discussed. Encouraged to start a food diary, count calories and to consider  joining a support group. Sample diet sheets offered. Goals set by the patient for the next several months.   Weight /BMI 10/30/2016 08/09/2016 07/25/2016  WEIGHT 254 lb 243 lb 239 lb  HEIGHT 5\' 5"  5\' 5"  5\' 5"   BMI 42.27 kg/m2 40.44 kg/m2 39.77 kg/m2

## 2016-11-22 ENCOUNTER — Encounter: Payer: 59 | Admitting: Family Medicine

## 2016-11-22 ENCOUNTER — Encounter: Payer: Self-pay | Admitting: Family Medicine

## 2016-12-23 ENCOUNTER — Telehealth: Payer: Self-pay

## 2016-12-23 MED ORDER — OSELTAMIVIR PHOSPHATE 75 MG PO CAPS: 75.0000 mg | ORAL_CAPSULE | Freq: Two times a day (BID) | ORAL | 0 refills | Status: AC

## 2016-12-23 NOTE — Telephone Encounter (Signed)
Medication sent per standing order and patient aware.

## 2016-12-23 NOTE — Telephone Encounter (Signed)
Yes pls send in

## 2017-04-04 ENCOUNTER — Telehealth: Payer: Self-pay | Admitting: Family Medicine

## 2017-04-04 NOTE — Telephone Encounter (Signed)
Patients work schedule does not accomodate our office hours  for her to able to come in for a annual physical, she wants to know what she should do regarding her labs.   [phone]

## 2017-04-04 NOTE — Telephone Encounter (Signed)
Message left to have fasting labs done and call back when she is able to schedule her physical

## 2017-07-31 ENCOUNTER — Encounter: Payer: Self-pay | Admitting: Family Medicine

## 2017-07-31 ENCOUNTER — Ambulatory Visit (INDEPENDENT_AMBULATORY_CARE_PROVIDER_SITE_OTHER): Payer: 59 | Admitting: Family Medicine

## 2017-07-31 VITALS — BP 118/70 | HR 83 | Temp 98.7°F | Ht 65.0 in | Wt 231.0 lb

## 2017-07-31 DIAGNOSIS — Z0000 Encounter for general adult medical examination without abnormal findings: Secondary | ICD-10-CM

## 2017-07-31 DIAGNOSIS — Z789 Other specified health status: Secondary | ICD-10-CM | POA: Diagnosis not present

## 2017-07-31 DIAGNOSIS — G8929 Other chronic pain: Secondary | ICD-10-CM

## 2017-07-31 DIAGNOSIS — M25562 Pain in left knee: Secondary | ICD-10-CM

## 2017-07-31 DIAGNOSIS — M542 Cervicalgia: Secondary | ICD-10-CM

## 2017-07-31 DIAGNOSIS — Z2821 Immunization not carried out because of patient refusal: Secondary | ICD-10-CM

## 2017-07-31 NOTE — Patient Instructions (Signed)
F/U in 6 months, call if you need me before  Fasting CBC, lipid, cmp and EGFr, TSH , vit D  Weight loss goal of  3 pounds per month   It is important that you exercise regularly at least 30 minutes 5 times a week. If you develop chest pain, have severe difficulty breathing, or feel very tired, stop exercising immediately and seek medical attention    Please work on good  health habits so that your health will improve. 1. Commitment to daily physical activity for 30 to 60  minutes, if you are able to do this.  2. Commitment to wise food choices. Aim for half of your  food intake to be vegetable and fruit, one quarter starchy foods, and one quarter protein. Try to eat on a regular schedule  3 meals per day, snacking between meals should be limited to vegetables or fruits or small portions of nuts. 64 ounces of water per day is generally recommended, unless you have specific health conditions, like heart failure or kidney failure where you will need to limit fluid intake.  3. Commitment to sufficient and a  good quality of physical and mental rest daily, generally between 6 to 8 hours per day.  WITH PERSISTANCE AND PERSEVERANCE, THE IMPOSSIBLE , BECOMES THE NORM!

## 2017-08-03 DIAGNOSIS — Z2821 Immunization not carried out because of patient refusal: Secondary | ICD-10-CM | POA: Insufficient documentation

## 2017-08-03 DIAGNOSIS — Z0000 Encounter for general adult medical examination without abnormal findings: Secondary | ICD-10-CM | POA: Insufficient documentation

## 2017-08-03 NOTE — Assessment & Plan Note (Signed)

## 2017-08-03 NOTE — Assessment & Plan Note (Signed)
Chronic , due to excess weight, normal  Xray of left knee  In 10/2016. Weight loss and strengthening of quads encouraged

## 2017-08-03 NOTE — Assessment & Plan Note (Signed)
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

## 2017-08-03 NOTE — Assessment & Plan Note (Signed)
Improved Patient re-educated about  the importance of commitment to a  minimum of 150 minutes of exercise per week.  The importance of healthy food choices with portion control discussed. Encouraged to start a food diary, count calories and to consider  joining a support group. Sample diet sheets offered. Goals set by the patient for the next several months.   Weight /BMI 07/31/2017 10/30/2016 08/09/2016  WEIGHT 231 lb 254 lb 243 lb  HEIGHT 5\' 5"  5\' 5"  5\' 5"   BMI 38.44 kg/m2 42.27 kg/m2 40.44 kg/m2
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
## 2017-08-03 NOTE — Assessment & Plan Note (Signed)
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

## 2018-01-29 ENCOUNTER — Ambulatory Visit: Payer: 59 | Admitting: Family Medicine

## 2018-02-26 ENCOUNTER — Ambulatory Visit: Payer: 59 | Admitting: Family Medicine

## 2018-04-16 ENCOUNTER — Ambulatory Visit: Payer: 59 | Admitting: Family Medicine

## 2019-03-03 ENCOUNTER — Other Ambulatory Visit: Payer: Self-pay | Admitting: Gastroenterology

## 2019-03-03 DIAGNOSIS — R1310 Dysphagia, unspecified: Secondary | ICD-10-CM

## 2019-03-12 ENCOUNTER — Ambulatory Visit
Admission: RE | Admit: 2019-03-12 | Discharge: 2019-03-12 | Disposition: A | Discharge status: Home / Self Care | Payer: No Typology Code available for payment source | Source: Ambulatory Visit | Attending: Gastroenterology | Admitting: Gastroenterology

## 2019-03-12 DIAGNOSIS — R1310 Dysphagia, unspecified: Secondary | ICD-10-CM

## 2019-04-28 ENCOUNTER — Ambulatory Visit: Payer: No Typology Code available for payment source | Admitting: Family Medicine

## 2019-05-07 ENCOUNTER — Ambulatory Visit: Payer: No Typology Code available for payment source | Admitting: Family Medicine

## 2019-06-03 ENCOUNTER — Ambulatory Visit: Payer: No Typology Code available for payment source | Admitting: Family Medicine

## 2019-06-04 ENCOUNTER — Ambulatory Visit: Payer: No Typology Code available for payment source | Admitting: Family Medicine

## 2019-06-11 ENCOUNTER — Other Ambulatory Visit: Payer: Self-pay | Admitting: Orthopedic Surgery

## 2019-06-11 DIAGNOSIS — M25562 Pain in left knee: Secondary | ICD-10-CM

## 2019-06-29 ENCOUNTER — Other Ambulatory Visit: Payer: No Typology Code available for payment source

## 2019-07-23 ENCOUNTER — Other Ambulatory Visit: Payer: No Typology Code available for payment source

## 2019-08-09 ENCOUNTER — Other Ambulatory Visit: Payer: Self-pay | Admitting: Orthopedic Surgery

## 2019-08-10 ENCOUNTER — Other Ambulatory Visit: Payer: Self-pay

## 2019-08-10 ENCOUNTER — Ambulatory Visit
Admission: RE | Admit: 2019-08-10 | Discharge: 2019-08-10 | Disposition: A | Discharge status: Home / Self Care | Payer: Self-pay | Source: Ambulatory Visit | Attending: Orthopedic Surgery | Admitting: Orthopedic Surgery

## 2019-08-10 DIAGNOSIS — M25562 Pain in left knee: Secondary | ICD-10-CM

## 2019-12-13 ENCOUNTER — Telehealth: Payer: Self-pay | Admitting: Family Medicine

## 2019-12-13 ENCOUNTER — Telehealth: Payer: Self-pay

## 2019-12-13 NOTE — Telephone Encounter (Signed)
pls reach out to pt, let her know I see info from minute clinic visit with elevated bP, I strongly encourage/ advise that she take care of this with OV, sere if willing to schedule  one, thanks

## 2019-12-17 ENCOUNTER — Ambulatory Visit (INDEPENDENT_AMBULATORY_CARE_PROVIDER_SITE_OTHER): Payer: Self-pay | Admitting: Otolaryngology

## 2019-12-23 ENCOUNTER — Other Ambulatory Visit: Payer: Self-pay | Admitting: Physician Assistant

## 2019-12-23 DIAGNOSIS — S199XXA Unspecified injury of neck, initial encounter: Secondary | ICD-10-CM

## 2019-12-27 ENCOUNTER — Ambulatory Visit
Admission: RE | Admit: 2019-12-27 | Discharge: 2019-12-27 | Disposition: A | Discharge status: Home / Self Care | Payer: No Typology Code available for payment source | Source: Ambulatory Visit | Attending: Physician Assistant | Admitting: Physician Assistant

## 2019-12-27 DIAGNOSIS — S199XXA Unspecified injury of neck, initial encounter: Secondary | ICD-10-CM

## 2019-12-29 ENCOUNTER — Encounter: Payer: Self-pay | Admitting: Family Medicine

## 2019-12-30 ENCOUNTER — Encounter: Payer: Self-pay | Admitting: Family Medicine

## 2020-02-09 NOTE — Telephone Encounter (Signed)
LVM

## 2020-03-23 ENCOUNTER — Encounter (HOSPITAL_BASED_OUTPATIENT_CLINIC_OR_DEPARTMENT_OTHER): Payer: Self-pay | Admitting: *Deleted

## 2020-03-23 ENCOUNTER — Emergency Department (HOSPITAL_BASED_OUTPATIENT_CLINIC_OR_DEPARTMENT_OTHER): Payer: No Typology Code available for payment source

## 2020-03-23 ENCOUNTER — Other Ambulatory Visit: Payer: Self-pay

## 2020-03-23 ENCOUNTER — Emergency Department (HOSPITAL_BASED_OUTPATIENT_CLINIC_OR_DEPARTMENT_OTHER)
Admission: EM | Admit: 2020-03-23 | Discharge: 2020-03-23 | Disposition: A | Discharge status: Home / Self Care | Payer: No Typology Code available for payment source | Attending: Emergency Medicine | Admitting: Emergency Medicine

## 2020-03-23 DIAGNOSIS — R109 Unspecified abdominal pain: Secondary | ICD-10-CM | POA: Diagnosis present

## 2020-03-23 DIAGNOSIS — N3090 Cystitis, unspecified without hematuria: Secondary | ICD-10-CM | POA: Diagnosis not present

## 2020-03-23 DIAGNOSIS — Z87891 Personal history of nicotine dependence: Secondary | ICD-10-CM | POA: Insufficient documentation

## 2020-03-23 LAB — COMPREHENSIVE METABOLIC PANEL
ALT: 15 U/L (ref 0–44)
AST: 16 U/L (ref 15–41)
Albumin: 3.8 g/dL (ref 3.5–5.0)
Alkaline Phosphatase: 68 U/L (ref 38–126)
Anion gap: 8 (ref 5–15)
BUN: 8 mg/dL (ref 6–20)
CO2: 24 mmol/L (ref 22–32)
Calcium: 8.7 mg/dL — ABNORMAL LOW (ref 8.9–10.3)
Chloride: 107 mmol/L (ref 98–111)
Creatinine, Ser: 0.69 mg/dL (ref 0.44–1.00)
GFR calc Af Amer: >60 mL/min (ref >60)
GFR calc non Af Amer: >60 mL/min (ref >60)
Glucose, Bld: 86 mg/dL (ref 70–99)
Potassium: 3.9 mmol/L (ref 3.5–5.1)
Sodium: 139 mmol/L (ref 135–145)
Total Bilirubin: 0.7 mg/dL (ref 0.3–1.2)
Total Protein: 7.3 g/dL (ref 6.5–8.1)

## 2020-03-23 LAB — URINALYSIS, ROUTINE W REFLEX MICROSCOPIC
Bilirubin Urine: NEGATIVE
Glucose, UA: NEGATIVE mg/dL
Hgb urine dipstick: NEGATIVE
Ketones, ur: NEGATIVE mg/dL
Leukocytes,Ua: NEGATIVE
Nitrite: NEGATIVE
Protein, ur: NEGATIVE mg/dL
Specific Gravity, Urine: 1.01 (ref 1.005–1.030)
pH: 6.5 (ref 5.0–8.0)

## 2020-03-23 LAB — CBC WITH DIFFERENTIAL/PLATELET
Abs Immature Granulocytes: 0.05 10*3/uL (ref 0.00–0.07)
Basophils Absolute: 0.1 10*3/uL (ref 0.0–0.1)
Basophils Relative: 1 %
Eosinophils Absolute: 0.1 10*3/uL (ref 0.0–0.5)
Eosinophils Relative: 1 %
HCT: 38.3 % (ref 36.0–46.0)
Hemoglobin: 12.9 g/dL (ref 12.0–15.0)
Immature Granulocytes: 1 %
Lymphocytes Relative: 36 %
Lymphs Abs: 1.9 10*3/uL (ref 0.7–4.0)
MCH: 29.1 pg (ref 26.0–34.0)
MCHC: 33.7 g/dL (ref 30.0–36.0)
MCV: 86.3 fL (ref 80.0–100.0)
Monocytes Absolute: 0.5 10*3/uL (ref 0.1–1.0)
Monocytes Relative: 9 %
Neutro Abs: 2.7 10*3/uL (ref 1.7–7.7)
Neutrophils Relative %: 52 %
Platelets: 313 10*3/uL (ref 150–400)
RBC: 4.44 MIL/uL (ref 3.87–5.11)
RDW: 14.3 % (ref 11.5–15.5)
WBC: 5.2 10*3/uL (ref 4.0–10.5)
nRBC: 0 % (ref 0.0–0.2)

## 2020-03-23 LAB — PREGNANCY, URINE: Preg Test, Ur: NEGATIVE

## 2020-03-23 LAB — LIPASE, BLOOD: Lipase: 22 U/L (ref 11–51)

## 2020-03-23 MED ORDER — SULFAMETHOXAZOLE-TRIMETHOPRIM 800-160 MG PO TABS
1.0000 | ORAL_TABLET | Freq: Two times a day (BID) | ORAL | 0 refills | Status: AC
Start: 2020-03-23 — End: 2020-03-30

## 2020-03-23 MED ORDER — SODIUM CHLORIDE 0.9 % IV SOLN
1.0000 g | Freq: Once | INTRAVENOUS | Status: AC
  Administered 2020-03-23: 16:00:00 1 g via INTRAVENOUS
  Filled 2020-03-23: qty 10

## 2020-03-23 MED ORDER — ONDANSETRON HCL 4 MG/2ML IJ SOLN
4.0000 mg | Freq: Once | INTRAMUSCULAR | Status: AC
  Administered 2020-03-23: 4 mg via INTRAVENOUS
  Filled 2020-03-23: qty 2

## 2020-03-23 MED ORDER — SODIUM CHLORIDE 0.9 % IV BOLUS
1000.0000 mL | Freq: Once | INTRAVENOUS | Status: AC
  Administered 2020-03-23: 1000 mL via INTRAVENOUS

## 2020-03-23 MED ORDER — MORPHINE SULFATE (PF) 4 MG/ML IV SOLN
4.0000 mg | Freq: Once | INTRAVENOUS | Status: AC
  Administered 2020-03-23: 4 mg via INTRAVENOUS
  Filled 2020-03-23: qty 1

## 2020-03-23 MED ORDER — ONDANSETRON 4 MG PO TBDP: ORAL_TABLET | ORAL | 0 refills | Status: AC

## 2020-03-23 NOTE — ED Notes (Signed)
ED Provider at bedside. 

## 2020-03-23 NOTE — ED Triage Notes (Signed)
Right flank pain x 2 days. Hx of same pain that resolved without tx. Dysuria and right groin pain. Pain started as right upper quadrant pain.

## 2020-03-23 NOTE — Discharge Instructions (Addendum)
Your urine looks clear today but your CT scan shows inflammation of the bladder suggestive of a UTI. Your lab work overall looks good. Have sent her urine for culture. Please take antibiotics as prescribed to treat for potential infection. Symptoms may also be related to a recently passed kidney stone. If you develop worsening pain, fevers, persistent vomiting or any other new or concerning symptoms please return to the ED for reevaluation. You can take prescribed Zofran as needed for nausea.

## 2020-03-23 NOTE — ED Provider Notes (Signed)
[facility] Provider Note   CSN: [id number] Arrival date & time: 03/23/20  1140     History Chief Complaint  Patient presents with  . Back Pain    Janet Gonzalez is a 39 y.o. female.  Janet Gonzalez is a 39 y.o. female with history of obesity, uterine fibroids, who presents ED for evaluation of right flank pain.  Patient states a few days ago she had some pain in her right upper quadrant that was pretty bruised to go.  But then 2 days ago she developed a more constant pain over her right flank that she describes as a dull constant ache that has worsened over the past 2 days.  She reports since this morning she has been having dysuria and urinary frequency and reports that whenever she has to go to the bathroom pain radiates from her flank into her right groin.  She denies any associated fevers or chills.  Does report some nausea with one episode of emesis this morning.  Denies history of kidney infections or kidney stones.  States that she has had some bone pains before that uncommon bone on their own but never pain this severe.  Patient denies any vaginal discharge or bleeding.  No concern for sexually transmitted infections.  She has not taken any medicine prior to arrival.  No other aggravating or alleviating factors.        Past Medical History:  Diagnosis Date  . Acute bacterial bronchitis   . Acute costochondritis   . Anxiety   . ASCUS with positive high risk HPV    Dx 01/2014   . Chest pain, unspecified   . Fibroid uterus   . GERD (gastroesophageal reflux disease)   . Gestational edema affecting puerperium 05/22/2016  . Menorrhagia with regular cycle   . OBESITY    Qualifier: Diagnosis of  By: Rebecca Eaton LPN, [place] Cerise    . Onychomycosis   . Unspecified vitamin D deficiency     Patient Active Problem List   Diagnosis Date Noted  . Visit for annual health examination 08/03/2017  . Refused H1N1 influenza virus immunization 08/03/2017  . Carpal  tunnel syndrome 08/09/2016  . GDM, class A1 05/19/2016  . Low back pain with radiation 08/29/2015  . Neck pain of over 3 months duration 07/18/2015  . Mid back pain, chronic 07/18/2015  . Knee pain, left 07/01/2013  . GERD (gastroesophageal reflux disease) 01/21/2013  . Fibroid uterus 07/15/2011  . Morbid obesity ([place]) 06/09/2008    Past Surgical History:  Procedure Laterality Date  . THYROIDECTOMY, PARTIAL    . WISDOM TOOTH EXTRACTION       OB History    Gravida  2   Para  2   Term  2   Preterm      AB      Living  2     SAB      TAB      Ectopic      Multiple  0   Live Births  2           Family History  Problem Relation Age of Onset  . Heart disease Mother   . Depression Mother   . COPD Maternal Grandmother     Social History   Tobacco Use  . Smoking status: Former Research scientist (life sciences)  . Smokeless tobacco: Never Used  Substance Use Topics  . Alcohol use: Yes    Comment: occasionally  . Drug use: No    Home Medications  Prior to Admission medications   Medication Sig Start Date End Date Taking? Authorizing Provider  calcium carbonate (TUMS - DOSED IN MG ELEMENTAL CALCIUM) 500 MG chewable tablet Chew 1 tablet by mouth daily.   Yes [provider]  Cholecalciferol (VITAMIN D-3 PO) Take 1 capsule by mouth daily.   Yes [provider]  folic acid (FOLVITE) A999333 MCG tablet Take 400 mcg by mouth daily.   Yes [provider]  ibuprofen (ADVIL,MOTRIN) 800 MG tablet Take 1 tablet (800 mg total) by mouth 3 (three) times daily. 10/30/16  Yes Fayrene Helper, MD  Pantoprazole Sodium (PROTONIX PO) Take by mouth.   Yes [provider]  Phentermine-Topiramate (QSYMIA) 7.5-46 MG CP24 Take by mouth. 02/22/20  Yes [provider]  Carbonyl Iron 25 MG TABS Take 1 tablet by mouth daily. Reported on 05/19/2016    [provider]  CRANBERRY CONCENTRATE PO Take 1 tablet by mouth daily.    [provider]   EPINEPHrine 0.3 mg/0.3 mL IJ SOAJ injection Inject 0.3 mLs (0.3 mg total) into the muscle once. Patient not taking: Reported on 07/31/2017 06/21/14   Fayrene Helper, MD  ondansetron (ZOFRAN ODT) 4 MG disintegrating tablet 4mg  ODT q4 hours prn nausea/vomit 03/23/20   Jacqlyn Larsen, PA-C  Prenatal Vit-Fe Fumarate-FA (MULTIVITAMIN-PRENATAL) 27-0.8 MG TABS tablet Take 1 tablet by mouth daily at 12 noon.    [provider]  sulfamethoxazole-trimethoprim (BACTRIM DS) 800-160 MG tablet Take 1 tablet by mouth 2 (two) times daily for 7 days. 03/23/20 03/30/20  Jacqlyn Larsen, PA-C    Allergies    Cephalexin and Gabapentin  Review of Systems   Review of Systems  Constitutional: Negative for chills and fever.  HENT: Negative.   Respiratory: Negative for cough and shortness of breath.   Cardiovascular: Negative for chest pain.  Gastrointestinal: Positive for abdominal pain, nausea and vomiting. Negative for constipation and diarrhea.  Genitourinary: Positive for dysuria, flank pain and frequency. Negative for hematuria, pelvic pain, vaginal bleeding and vaginal discharge.  Musculoskeletal: Negative for arthralgias and myalgias.  Skin: Negative for color change and rash.  Neurological: Negative for dizziness, syncope and light-headedness.  All other systems reviewed and are negative.   Physical Exam Updated Vital Signs BP 118/88   Pulse 87   Temp 98.4 F (36.9 C) (Oral)   Resp 16   Ht 5\' 5"  (1.651 m)   Wt 118.7 kg   LMP 03/02/2020   SpO2 100%   BMI 43.53 kg/m   Physical Exam Vitals and nursing note reviewed.  Constitutional:      General: She is not in acute distress.    Appearance: Normal appearance. She is well-developed. She is obese. She is not ill-appearing or diaphoretic.     Comments: Well-appearing and in no distress   HENT:     Head: Normocephalic and atraumatic.     Mouth/Throat:     Mouth: Mucous membranes are moist.     Pharynx: Oropharynx is clear.  Eyes:      General:        Right eye: No discharge.        Left eye: No discharge.  Cardiovascular:     Rate and Rhythm: Normal rate and regular rhythm.     Pulses: Normal pulses.     Heart sounds: Normal heart sounds. No murmur. No friction rub. No gallop.   Pulmonary:     Effort: Pulmonary effort is normal. No respiratory distress.  Breath sounds: Normal breath sounds. No wheezing or rales.     Comments: Respirations equal and unlabored, patient able to speak in full sentences, lungs clear to auscultation bilaterally Abdominal:     General: Bowel sounds are normal. There is no distension.     Palpations: Abdomen is soft. There is no mass.     Tenderness: There is abdominal tenderness. There is right CVA tenderness. There is no guarding.     Comments: Abdomen is soft, nondistended, bowel sounds present throughout, there is some tenderness in the right lower quadrant as well as tenderness over the right flank, no guarding or peritoneal signs, nontender in all other quadrants.  No focal right upper quadrant tenderness or Murphy sign  Musculoskeletal:        General: No deformity.     Cervical back: Neck supple.     Comments: No midline back tenderness.  Skin:    General: Skin is warm and dry.     Capillary Refill: Capillary refill takes less than 2 seconds.  Neurological:     Mental Status: She is alert.     Coordination: Coordination normal.     Comments: Speech is clear, able to follow commands Moves extremities without ataxia, coordination intact  Psychiatric:        Mood and Affect: Mood normal.        Behavior: Behavior normal.     ED Results / Procedures / Treatments   Labs (all labs ordered are listed, but only abnormal results are displayed) Labs Reviewed  COMPREHENSIVE METABOLIC PANEL - Abnormal; Notable for the following components:      Result Value   Calcium 8.7 (*)    All other components within normal limits  URINE CULTURE  URINALYSIS, ROUTINE W REFLEX MICROSCOPIC   PREGNANCY, URINE  CBC WITH DIFFERENTIAL/PLATELET  LIPASE, BLOOD    EKG None  Radiology CT Renal Stone Study  Result Date: 03/23/2020 CLINICAL DATA:  Right flank pain and right groin pain for 2 days, dysuria EXAM: CT ABDOMEN AND PELVIS WITHOUT CONTRAST TECHNIQUE: Multidetector CT imaging of the abdomen and pelvis was performed following the standard protocol without IV contrast. COMPARISON:  None. FINDINGS: Lower chest: No acute abnormality. Hepatobiliary: Liver has an unremarkable noncontrast appearance. Gallbladder within normal limits. No calcified gallstone. No biliary dilatation. Pancreas: Unremarkable. No pancreatic ductal dilatation or surrounding inflammatory changes. Spleen: Normal in size without focal abnormality. Adrenals/Urinary Tract: Unremarkable adrenal glands. Kidneys are normal in size. No evidence of renal mass, stone, or hydronephrosis. Ureters are nondilated. No ureteral calculi identified. Mild diffuse urinary bladder wall thickening. Stomach/Bowel: Stomach is within normal limits. Appendix appears normal (series 2, image 47). No evidence of bowel wall thickening, distention, or inflammatory changes. Mild-moderate volume of stool within the colon Vascular/Lymphatic: No significant vascular findings are present. No enlarged abdominal or pelvic lymph nodes. Reproductive: Uterus and bilateral adnexa are unremarkable. Other: No abdominal wall hernia or abnormality. No abdominopelvic ascites. Musculoskeletal: No acute or significant osseous findings. IMPRESSION: 1. Mild diffuse urinary bladder wall thickening, which may represent cystitis. Correlation with urinalysis recommended. 2. No renal or ureteral calculi.  No hydronephrosis. 3. Otherwise, no acute abdominopelvic findings. Normal appendix. 4. Mild-moderate volume of stool within the colon. Electronically Signed   By: Davina Poke D.O.   On: 03/23/2020 13:43    Procedures Procedures (including critical care time)   Medications Ordered in ED Medications  sodium chloride 0.9 % bolus 1,000 mL (0 mLs Intravenous Stopped 03/23/20 1513)  ondansetron (ZOFRAN) injection  4 mg (4 mg Intravenous Given 03/23/20 1346)  morphine 4 MG/ML injection 4 mg (4 mg Intravenous Given 03/23/20 1346)  cefTRIAXone (ROCEPHIN) 1 g in sodium chloride 0.9 % 100 mL IVPB (0 g Intravenous Stopped 03/23/20 1604)    ED Course  I have reviewed the triage vital signs and the nursing notes.  Pertinent labs & imaging results that were available during my care of the patient were reviewed by me and considered in my medical decision making (see chart for details).    MDM Rules/Calculators/A&P                     Patient presents to the ED with complaints of flank/abdominal pain with dysuria and frequency. Patient nontoxic appearing, vitals without significant abnormalities. On exam patient is tender in RLQ and over the right flank, no peritoneal signs. DDX: nephrolithiasis, pyelonephritis/UTI, cholecystitis, pancreatitis, bowel obstruction/perforation, appendicitis, dissection, feel nephrolithiasis is most likely at this time will evaluate with labs and CT renal study, analgesics, anti-emetics, and fluids ordered.   No leukocytosis, anemia, or significant electrolyte derangements.  CT renal study with Mild diffuse urinary bladder wall thickening, which may represent cystitis. Renal function preserved. Urinalysis without signs of infection despite symptoms and signs of cystitis on CT.  Given CT changes will treat with antibiotics, and given worsening right flank pain will give IV dose of Rocephin to treat for potential Pyelo.  Patient has allergy to Keflex, will treat with Bactrim. Patient tolerating PO in the ER with pain well controlled. Will discharge home with Zofran, and NSAIDs with PCP/urology follow up. I discussed results, treatment plan, need for urology follow-up, and return precautions with the patient. Provided opportunity for questions,  patient confirmed understanding and is in agreement with plan.   Final Clinical Impression(s) / ED Diagnoses Final diagnoses:  Cystitis  Right flank pain    Rx / DC Orders ED Discharge Orders         Ordered    sulfamethoxazole-trimethoprim (BACTRIM DS) 800-160 MG tablet  2 times daily     03/23/20 1533    ondansetron (ZOFRAN ODT) 4 MG disintegrating tablet     03/23/20 1533           Benedetto Goad [place], [place] 03/28/20 0158    Wyvonnia Dusky, MD 03/28/20 2019

## 2020-03-24 LAB — URINE CULTURE

## 2020-04-14 ENCOUNTER — Other Ambulatory Visit (HOSPITAL_COMMUNITY): Payer: Self-pay | Admitting: Gastroenterology

## 2020-04-14 ENCOUNTER — Other Ambulatory Visit: Payer: Self-pay | Admitting: Gastroenterology

## 2020-04-14 DIAGNOSIS — R1011 Right upper quadrant pain: Secondary | ICD-10-CM

## 2020-04-26 ENCOUNTER — Encounter (HOSPITAL_COMMUNITY)
Admission: RE | Admit: 2020-04-26 | Discharge: 2020-04-26 | Disposition: A | Discharge status: Home / Self Care | Payer: No Typology Code available for payment source | Source: Ambulatory Visit | Attending: Gastroenterology | Admitting: Gastroenterology

## 2020-04-26 ENCOUNTER — Other Ambulatory Visit: Payer: Self-pay

## 2020-04-26 DIAGNOSIS — R1011 Right upper quadrant pain: Secondary | ICD-10-CM | POA: Diagnosis present

## 2020-04-26 MED ORDER — TECHNETIUM TC 99M MEBROFENIN IV KIT
5.4200 | PACK | Freq: Once | INTRAVENOUS | Status: AC | PRN
  Administered 2020-04-26: 5.42 via INTRAVENOUS

## 2020-06-22 ENCOUNTER — Other Ambulatory Visit: Payer: Self-pay | Admitting: General Surgery

## 2020-06-22 DIAGNOSIS — K828 Other specified diseases of gallbladder: Secondary | ICD-10-CM

## 2020-07-04 ENCOUNTER — Other Ambulatory Visit: Payer: No Typology Code available for payment source

## 2020-07-06 ENCOUNTER — Ambulatory Visit
Admission: RE | Admit: 2020-07-06 | Discharge: 2020-07-06 | Disposition: A | Discharge status: Home / Self Care | Payer: No Typology Code available for payment source | Source: Ambulatory Visit | Attending: General Surgery | Admitting: General Surgery

## 2020-07-06 DIAGNOSIS — K828 Other specified diseases of gallbladder: Secondary | ICD-10-CM

## 2020-07-13 ENCOUNTER — Other Ambulatory Visit: Payer: Self-pay | Admitting: General Surgery

## 2020-07-26 ENCOUNTER — Telehealth: Payer: Self-pay

## 2020-07-26 NOTE — Telephone Encounter (Signed)
Pt returning call from [place]  1155am

## 2020-07-26 NOTE — Telephone Encounter (Signed)
Do u still want to speak with her regarding her mom?

## 2020-08-01 NOTE — Telephone Encounter (Signed)
I spoke directly with Janet Gonzalez and advised that I am concerned regarding the safety of her Mom driving to [place] and recommended she gets someone to drive her. [name] states that her brother is able and available and she will follow up on this. States her Mom does nothing all day. I encouraged her to try to get an appointment with her Psychiatrist next time she goes, as I think that her psych meds contribute significantly to her reduced level of alertness, states she has tried to go, but mother states she is unable to

## 2020-09-19 ENCOUNTER — Encounter (HOSPITAL_BASED_OUTPATIENT_CLINIC_OR_DEPARTMENT_OTHER): Payer: Self-pay | Admitting: General Surgery

## 2020-09-19 ENCOUNTER — Other Ambulatory Visit: Payer: Self-pay

## 2020-09-22 ENCOUNTER — Other Ambulatory Visit (HOSPITAL_COMMUNITY)
Admission: RE | Admit: 2020-09-22 | Discharge: 2020-09-22 | Disposition: A | Discharge status: Home / Self Care | Payer: No Typology Code available for payment source | Source: Ambulatory Visit | Attending: General Surgery | Admitting: General Surgery

## 2020-09-22 DIAGNOSIS — Z20822 Contact with and (suspected) exposure to covid-19: Secondary | ICD-10-CM | POA: Diagnosis not present

## 2020-09-22 DIAGNOSIS — Z01812 Encounter for preprocedural laboratory examination: Secondary | ICD-10-CM | POA: Diagnosis not present

## 2020-09-22 MED ORDER — ENSURE PRE-SURGERY PO LIQD: 296.0000 mL | Freq: Once | ORAL | Status: DC

## 2020-09-22 NOTE — Progress Notes (Signed)
Anesthesia consult per Dr. Ellender, will proceed with surgery as scheduled.  

## 2020-09-22 NOTE — Progress Notes (Signed)

## 2020-09-23 LAB — SARS CORONAVIRUS 2 (TAT 6-24 HRS): SARS Coronavirus 2: NEGATIVE

## 2020-09-26 ENCOUNTER — Encounter (HOSPITAL_BASED_OUTPATIENT_CLINIC_OR_DEPARTMENT_OTHER): Payer: Self-pay | Admitting: General Surgery

## 2020-09-26 ENCOUNTER — Other Ambulatory Visit: Payer: Self-pay

## 2020-09-26 ENCOUNTER — Ambulatory Visit (HOSPITAL_BASED_OUTPATIENT_CLINIC_OR_DEPARTMENT_OTHER)
Admission: RE | Admit: 2020-09-26 | Discharge: 2020-09-26 | Disposition: A | Discharge status: Home / Self Care | Payer: No Typology Code available for payment source | Attending: General Surgery | Admitting: General Surgery

## 2020-09-26 ENCOUNTER — Ambulatory Visit (HOSPITAL_BASED_OUTPATIENT_CLINIC_OR_DEPARTMENT_OTHER): Payer: No Typology Code available for payment source | Admitting: Certified Registered"

## 2020-09-26 ENCOUNTER — Encounter (HOSPITAL_BASED_OUTPATIENT_CLINIC_OR_DEPARTMENT_OTHER)
Admission: RE | Disposition: A | Discharge status: Home / Self Care | Payer: Self-pay | Source: Home / Self Care | Attending: General Surgery

## 2020-09-26 DIAGNOSIS — K219 Gastro-esophageal reflux disease without esophagitis: Secondary | ICD-10-CM | POA: Diagnosis not present

## 2020-09-26 DIAGNOSIS — Z8261 Family history of arthritis: Secondary | ICD-10-CM | POA: Insufficient documentation

## 2020-09-26 DIAGNOSIS — Z8371 Family history of colonic polyps: Secondary | ICD-10-CM | POA: Insufficient documentation

## 2020-09-26 DIAGNOSIS — Z6841 Body Mass Index (BMI) 40.0 and over, adult: Secondary | ICD-10-CM | POA: Diagnosis not present

## 2020-09-26 DIAGNOSIS — Z833 Family history of diabetes mellitus: Secondary | ICD-10-CM | POA: Insufficient documentation

## 2020-09-26 DIAGNOSIS — Z8249 Family history of ischemic heart disease and other diseases of the circulatory system: Secondary | ICD-10-CM | POA: Insufficient documentation

## 2020-09-26 DIAGNOSIS — Z79899 Other long term (current) drug therapy: Secondary | ICD-10-CM | POA: Insufficient documentation

## 2020-09-26 DIAGNOSIS — Z818 Family history of other mental and behavioral disorders: Secondary | ICD-10-CM | POA: Insufficient documentation

## 2020-09-26 DIAGNOSIS — Z823 Family history of stroke: Secondary | ICD-10-CM | POA: Diagnosis not present

## 2020-09-26 DIAGNOSIS — Z8379 Family history of other diseases of the digestive system: Secondary | ICD-10-CM | POA: Diagnosis not present

## 2020-09-26 DIAGNOSIS — Z881 Allergy status to other antibiotic agents status: Secondary | ICD-10-CM | POA: Diagnosis not present

## 2020-09-26 DIAGNOSIS — K811 Chronic cholecystitis: Secondary | ICD-10-CM | POA: Diagnosis not present

## 2020-09-26 DIAGNOSIS — Z23 Encounter for immunization: Secondary | ICD-10-CM | POA: Insufficient documentation

## 2020-09-26 DIAGNOSIS — K8044 Calculus of bile duct with chronic cholecystitis without obstruction: Secondary | ICD-10-CM | POA: Diagnosis present

## 2020-09-26 DIAGNOSIS — Z888 Allergy status to other drugs, medicaments and biological substances status: Secondary | ICD-10-CM | POA: Insufficient documentation

## 2020-09-26 DIAGNOSIS — Z87891 Personal history of nicotine dependence: Secondary | ICD-10-CM | POA: Diagnosis not present

## 2020-09-26 DIAGNOSIS — Z8349 Family history of other endocrine, nutritional and metabolic diseases: Secondary | ICD-10-CM | POA: Insufficient documentation

## 2020-09-26 HISTORY — PX: CHOLECYSTECTOMY: SHX55

## 2020-09-26 LAB — POCT PREGNANCY, URINE: Preg Test, Ur: NEGATIVE

## 2020-09-26 SURGERY — LAPAROSCOPIC CHOLECYSTECTOMY
Anesthesia: General | Site: Abdomen

## 2020-09-26 MED ORDER — LIDOCAINE 2% (20 MG/ML) 5 ML SYRINGE
INTRAMUSCULAR | Status: AC
  Filled 2020-09-26: qty 5

## 2020-09-26 MED ORDER — SUGAMMADEX SODIUM 500 MG/5ML IV SOLN
INTRAVENOUS | Status: AC
  Filled 2020-09-26: qty 5

## 2020-09-26 MED ORDER — OXYCODONE HCL 5 MG/5ML PO SOLN: 5.0000 mg | Freq: Once | ORAL | Status: DC | PRN

## 2020-09-26 MED ORDER — FENTANYL CITRATE (PF) 100 MCG/2ML IJ SOLN
INTRAMUSCULAR | Status: AC
  Filled 2020-09-26: qty 2

## 2020-09-26 MED ORDER — ROCURONIUM BROMIDE 100 MG/10ML IV SOLN
INTRAVENOUS | Status: DC | PRN
  Administered 2020-09-26: 100 mg via INTRAVENOUS

## 2020-09-26 MED ORDER — ACETAMINOPHEN 500 MG PO TABS
1000.0000 mg | ORAL_TABLET | ORAL | Status: AC
  Administered 2020-09-26: 1000 mg via ORAL

## 2020-09-26 MED ORDER — FENTANYL CITRATE (PF) 100 MCG/2ML IJ SOLN
INTRAMUSCULAR | Status: DC | PRN
  Administered 2020-09-26 (×2): 50 ug via INTRAVENOUS

## 2020-09-26 MED ORDER — ONDANSETRON HCL 4 MG/2ML IJ SOLN
INTRAMUSCULAR | Status: DC | PRN
  Administered 2020-09-26: 4 mg via INTRAVENOUS

## 2020-09-26 MED ORDER — LIDOCAINE 2% (20 MG/ML) 5 ML SYRINGE
INTRAMUSCULAR | Status: DC | PRN
  Administered 2020-09-26: 60 mg via INTRAVENOUS

## 2020-09-26 MED ORDER — LACTATED RINGERS IV SOLN: INTRAVENOUS | Status: DC

## 2020-09-26 MED ORDER — MEPERIDINE HCL 25 MG/ML IJ SOLN: 6.2500 mg | INTRAMUSCULAR | Status: DC | PRN

## 2020-09-26 MED ORDER — KETOROLAC TROMETHAMINE 30 MG/ML IJ SOLN
INTRAMUSCULAR | Status: AC
  Filled 2020-09-26: qty 1

## 2020-09-26 MED ORDER — CIPROFLOXACIN IN D5W 400 MG/200ML IV SOLN
400.0000 mg | INTRAVENOUS | Status: AC
  Administered 2020-09-26: 400 mg via INTRAVENOUS

## 2020-09-26 MED ORDER — SODIUM CHLORIDE 0.9 % IR SOLN
Status: DC | PRN
  Administered 2020-09-26: 1

## 2020-09-26 MED ORDER — MIDAZOLAM HCL 2 MG/2ML IJ SOLN
2.0000 mg | Freq: Once | INTRAMUSCULAR | Status: AC
  Administered 2020-09-26: 2 mg via INTRAVENOUS

## 2020-09-26 MED ORDER — MIDAZOLAM HCL 2 MG/2ML IJ SOLN
INTRAMUSCULAR | Status: AC
  Filled 2020-09-26: qty 2

## 2020-09-26 MED ORDER — PROPOFOL 10 MG/ML IV BOLUS
INTRAVENOUS | Status: AC
  Filled 2020-09-26: qty 40

## 2020-09-26 MED ORDER — OXYCODONE HCL 5 MG PO TABS
5.0000 mg | ORAL_TABLET | Freq: Four times a day (QID) | ORAL | 0 refills | Status: AC | PRN
Start: 2020-09-26 — End: ?

## 2020-09-26 MED ORDER — METHYLENE BLUE 0.5 % INJ SOLN
INTRAVENOUS | Status: AC
  Filled 2020-09-26: qty 20

## 2020-09-26 MED ORDER — BUPIVACAINE HCL (PF) 0.25 % IJ SOLN
INTRAMUSCULAR | Status: AC
  Filled 2020-09-26: qty 150

## 2020-09-26 MED ORDER — ACETAMINOPHEN 500 MG PO TABS
ORAL_TABLET | ORAL | Status: AC
  Filled 2020-09-26: qty 2

## 2020-09-26 MED ORDER — ONDANSETRON HCL 4 MG/2ML IJ SOLN
INTRAMUSCULAR | Status: AC
  Filled 2020-09-26: qty 2

## 2020-09-26 MED ORDER — PROMETHAZINE HCL 25 MG/ML IJ SOLN: 6.2500 mg | INTRAMUSCULAR | Status: DC | PRN

## 2020-09-26 MED ORDER — SODIUM CHLORIDE (PF) 0.9 % IJ SOLN
INTRAMUSCULAR | Status: AC
  Filled 2020-09-26: qty 20

## 2020-09-26 MED ORDER — BUPIVACAINE HCL (PF) 0.25 % IJ SOLN
INTRAMUSCULAR | Status: DC | PRN
  Administered 2020-09-26: 10 mL

## 2020-09-26 MED ORDER — DEXAMETHASONE SODIUM PHOSPHATE 10 MG/ML IJ SOLN
INTRAMUSCULAR | Status: AC
  Filled 2020-09-26: qty 1

## 2020-09-26 MED ORDER — ROCURONIUM BROMIDE 10 MG/ML (PF) SYRINGE
PREFILLED_SYRINGE | INTRAVENOUS | Status: AC
  Filled 2020-09-26: qty 10

## 2020-09-26 MED ORDER — FENTANYL CITRATE (PF) 100 MCG/2ML IJ SOLN
100.0000 ug | Freq: Once | INTRAMUSCULAR | Status: AC
  Administered 2020-09-26: 100 ug via INTRAVENOUS

## 2020-09-26 MED ORDER — CIPROFLOXACIN IN D5W 400 MG/200ML IV SOLN
INTRAVENOUS | Status: AC
  Filled 2020-09-26: qty 200

## 2020-09-26 MED ORDER — PHENYLEPHRINE 40 MCG/ML (10ML) SYRINGE FOR IV PUSH (FOR BLOOD PRESSURE SUPPORT)
PREFILLED_SYRINGE | INTRAVENOUS | Status: AC
  Filled 2020-09-26: qty 10

## 2020-09-26 MED ORDER — OXYCODONE HCL 5 MG PO TABS: 5.0000 mg | ORAL_TABLET | Freq: Once | ORAL | Status: DC | PRN

## 2020-09-26 MED ORDER — BUPIVACAINE HCL (PF) 0.25 % IJ SOLN
INTRAMUSCULAR | Status: DC | PRN
  Administered 2020-09-26: 60 mL

## 2020-09-26 MED ORDER — KETOROLAC TROMETHAMINE 15 MG/ML IJ SOLN
INTRAMUSCULAR | Status: AC
  Filled 2020-09-26: qty 1

## 2020-09-26 MED ORDER — PHENYLEPHRINE HCL (PRESSORS) 10 MG/ML IV SOLN
INTRAVENOUS | Status: DC | PRN
  Administered 2020-09-26 (×2): 80 ug via INTRAVENOUS

## 2020-09-26 MED ORDER — PROPOFOL 10 MG/ML IV BOLUS
INTRAVENOUS | Status: DC | PRN
  Administered 2020-09-26: 200 mg via INTRAVENOUS

## 2020-09-26 MED ORDER — HYDROMORPHONE HCL 1 MG/ML IJ SOLN: 0.2500 mg | INTRAMUSCULAR | Status: DC | PRN

## 2020-09-26 MED ORDER — KETOROLAC TROMETHAMINE 15 MG/ML IJ SOLN
15.0000 mg | INTRAMUSCULAR | Status: AC
  Administered 2020-09-26: 15 mg via INTRAVENOUS

## 2020-09-26 MED ORDER — AMISULPRIDE (ANTIEMETIC) 5 MG/2ML IV SOLN: 10.0000 mg | Freq: Once | INTRAVENOUS | Status: DC | PRN

## 2020-09-26 MED ORDER — SUGAMMADEX SODIUM 200 MG/2ML IV SOLN
INTRAVENOUS | Status: DC | PRN
  Administered 2020-09-26: 500 mg via INTRAVENOUS

## 2020-09-26 MED ORDER — DEXAMETHASONE SODIUM PHOSPHATE 10 MG/ML IJ SOLN
INTRAMUSCULAR | Status: DC | PRN
  Administered 2020-09-26: 10 mg via INTRAVENOUS

## 2020-09-26 SURGICAL SUPPLY — 41 items
ADH SKN CLS APL DERMABOND .7 (GAUZE/BANDAGES/DRESSINGS) ×1
APL PRP STRL LF DISP 70% ISPRP (MISCELLANEOUS) ×1
APPLIER CLIP 5 13 M/L LIGAMAX5 (MISCELLANEOUS) ×2
APR CLP MED LRG 5 ANG JAW (MISCELLANEOUS) ×1
BAG SPEC RTRVL 10 TROC 200 (ENDOMECHANICALS) ×1
CANISTER SUCT 1200ML W/VALVE (MISCELLANEOUS) ×2 IMPLANT
CHLORAPREP W/TINT 26 (MISCELLANEOUS) ×2 IMPLANT
CLIP APPLIE 5 13 M/L LIGAMAX5 (MISCELLANEOUS) ×1 IMPLANT
DERMABOND ADVANCED (GAUZE/BANDAGES/DRESSINGS) ×1
DERMABOND ADVANCED .7 DNX12 (GAUZE/BANDAGES/DRESSINGS) ×1 IMPLANT
DRAPE LAPAROSCOPIC ABDOMINAL (DRAPES) ×2 IMPLANT
ELECT REM PT RETURN 9FT ADLT (ELECTROSURGICAL) ×2
ELECTRODE REM PT RTRN 9FT ADLT (ELECTROSURGICAL) ×1 IMPLANT
GLOVE BIO SURGEON STRL SZ 6.5 (GLOVE) ×1 IMPLANT
GLOVE BIO SURGEON STRL SZ7 (GLOVE) ×2 IMPLANT
GLOVE BIOGEL PI IND STRL 6.5 (GLOVE) IMPLANT
GLOVE BIOGEL PI IND STRL 7.5 (GLOVE) ×1 IMPLANT
GLOVE BIOGEL PI INDICATOR 6.5 (GLOVE) ×1
GLOVE BIOGEL PI INDICATOR 7.5 (GLOVE) ×1
GLOVE ECLIPSE 6.5 STRL STRAW (GLOVE) ×1 IMPLANT
GOWN STRL REUS W/ TWL LRG LVL3 (GOWN DISPOSABLE) ×3 IMPLANT
GOWN STRL REUS W/TWL LRG LVL3 (GOWN DISPOSABLE) ×6
GRASPER SUT TROCAR 14GX15 (MISCELLANEOUS) ×1 IMPLANT
HEMOSTAT SNOW SURGICEL 2X4 (HEMOSTASIS) IMPLANT
NS IRRIG 1000ML POUR BTL (IV SOLUTION) ×2 IMPLANT
PACK BASIN DAY SURGERY FS (CUSTOM PROCEDURE TRAY) ×2 IMPLANT
POUCH RETRIEVAL ECOSAC 10 (ENDOMECHANICALS) ×1 IMPLANT
POUCH RETRIEVAL ECOSAC 10MM (ENDOMECHANICALS) ×2
SCISSORS LAP 5X35 DISP (ENDOMECHANICALS) ×2 IMPLANT
SET IRRIG TUBING LAPAROSCOPIC (IRRIGATION / IRRIGATOR) ×2 IMPLANT
SET TUBE SMOKE EVAC HIGH FLOW (TUBING) ×2 IMPLANT
SLEEVE ENDOPATH XCEL 5M (ENDOMECHANICALS) ×4 IMPLANT
SLEEVE SCD COMPRESS KNEE MED (MISCELLANEOUS) ×2 IMPLANT
STRIP CLOSURE SKIN 1/2X4 (GAUZE/BANDAGES/DRESSINGS) ×2 IMPLANT
SUT MNCRL AB 4-0 PS2 18 (SUTURE) ×2 IMPLANT
SUT VICRYL 0 UR6 27IN ABS (SUTURE) ×1 IMPLANT
TOWEL GREEN STERILE FF (TOWEL DISPOSABLE) ×3 IMPLANT
TRAY LAPAROSCOPIC (CUSTOM PROCEDURE TRAY) ×2 IMPLANT
TROCAR XCEL BLUNT TIP 100MML (ENDOMECHANICALS) ×2 IMPLANT
TROCAR XCEL NON-BLD 5MMX100MML (ENDOMECHANICALS) ×2 IMPLANT
TUBE CONNECTING 20X1/4 (TUBING) ×2 IMPLANT

## 2020-09-26 NOTE — Op Note (Signed)
Preoperative diagnosis: Biliary dyskinesia postoperative diagnosis: Same as above procedure: Laparoscopic cholecystectomy Surgeon: Dr. Serita Grammes anesthesia: General with bilateral tap blocks estimated blood loss: Minimal complications: None drains: None specimens: Gallbladder and contents to pathology sponge needle count was correct at completion disposition to recovery stable condition  Indications:39 yof referred by Dr Alessandra Bevels for ruq pain. in computer she as ruq pain with essentially normal imaging.  recent ruq pain has happened a couple times in past year or two. latest episodehas been for some time and required er visit. she had normal labs and gb was unremarkable on renal ct scan. she also received second dose covid vaccine and she states that has made nausea and other ab symptoms worse since then. she has no emesis (except right after vaccine). she has ruq pain, it is somewhat related to eating but she is still eating fine. she has globus sensation and this is being evaluated and made worse with recent trauma.  We discussed laparoscopic cholecystectomy.  Procedure: After informed consent was obtained the patient first underwent bilateral tap blocks.  She was given antibiotics.  SCDs were in place.  She was placed under general anesthesia without complication.  She was prepped and draped in the standard sterile surgical fashion.  A surgical timeout was then performed.  I infiltrated Marcaine below the umbilicus.  I made a vertical incision.  I grasped the fascia and incised this sharply.  The peritoneum was entered bluntly.  There was no evidence of an entry injury.  I placed a 0 Vicryl pursestring suture through the fascia.  I then introduced a Hassan trocar and insufflated the abdomen to 15 mmHg pressure.  I then placed 3 additional 5 mm trochars in epigastrium and right upper quadrant under direct vision without complication.  The gallbladder appeared to be abnormal.  It had  evidence of chronic cholecystitis.  I retracted it cephalad and lateral.  I then was able to dissect the triangle and obtain the critical view of safety.  I clearly identified the cystic duct and 6 cystic artery.  I did remove a portion of the gallbladder from the cystic plate to ensure that I had a good view.  I then clipped the artery 3 times and divided it leaving 2 clips in place.  I clipped the duct in a similar fashion.  The duct was viable and the clips completely traversed the duct.  I then took the gallbladder off the liver bed and placed it in a retrieval bag and removed from the abdomen.  Hemostasis was observed.  I removed the [facility] trocar and tied my pursestring down.  I used the suture passer device and placed an additional 0 Vicryl suture to completely obliterate this defect.  The remaining trochars were removed after the abdomen was desufflated.  I then closed these with 4-0 Monocryl and glue.  She tolerated this well was extubated and transferred recovery stable.

## 2020-09-26 NOTE — Discharge Instructions (Signed)
[facility] LAPAROSCOPIC SURGERY: POST OP INSTRUCTIONS  Always review your discharge instruction sheet given to you by the facility where your surgery was performed. IF YOU HAVE DISABILITY OR FAMILY LEAVE FORMS, YOU MUST BRING THEM TO THE OFFICE FOR PROCESSING.   DO NOT GIVE THEM TO YOUR DOCTOR.  1. A prescription for pain medication may be given to you upon discharge.  Take your pain medication as prescribed, if needed.  If narcotic pain medicine is not needed, then you may take acetaminophen (Tylenol), naprosyn (Alleve), or ibuprofen (Advil) as needed. 2. Take your usually prescribed medications unless otherwise directed. 3. If you need a refill on your pain medication, please contact your pharmacy.  They will contact our office to request authorization. Prescriptions will not be filled after 5pm or on week-ends. 4. You should follow a light diet the first few days after arrival home, such as soup and crackers, etc.  Be sure to include lots of fluids daily. 5. Most patients will experience some swelling and bruising in the area of the incisions.  Ice packs will help.  Swelling and bruising can take several days to resolve.  6. It is common to experience some constipation if taking pain medication after surgery.  Increasing fluid intake and taking a stool softener (such as Colace) will usually help or prevent this problem from occurring.  A mild laxative (Milk of Magnesia or Miralax) should be taken according to package instructions if there are no bowel movements after 48 hours. 7. Unless discharge instructions indicate otherwise, you may remove your bandages 48 hours after surgery, and you may shower at that time.  You may have steri-strips (small skin tapes) in place directly over the incision.  These strips should be left on the skin for 7-10 days.  If your surgeon used skin glue on the incision, you may shower in 24 hours.  The glue will flake  off over the next 2-3 weeks.  Any sutures or staples will be removed at the office during your follow-up visit. 8. ACTIVITIES:  You may resume regular (light) daily activities beginning the next day--such as daily self-care, walking, climbing stairs--gradually increasing activities as tolerated.  You may have sexual intercourse when it is comfortable.  Refrain from any heavy lifting or straining until approved by your doctor. a. You may drive when you are no longer taking prescription pain medication, you can comfortably wear a seatbelt, and you can safely maneuver your car and apply brakes. b. RETURN TO WORK:  __________________________________________________________ 9. You should see your doctor in the office for a follow-up appointment approximately 2-3 weeks after your surgery.  Make sure that you call for this appointment within a day or two after you arrive home to insure a convenient appointment time. 10. OTHER INSTRUCTIONS: __________________________________________________________________________________________________________________________ __________________________________________________________________________________________________________________________ WHEN TO CALL YOUR DOCTOR: 1. Fever over 101.0 2. Inability to urinate 3. Continued bleeding from incision. 4. Increased pain, redness, or drainage from the incision. 5. Increasing abdominal pain  The clinic staff is available to answer your questions during regular business hours.  Please don't hesitate to call and ask to speak to one of the nurses for clinical concerns.  If you have a medical emergency, go to the nearest emergency room or call 911.  A surgeon from [facility] is always on call at the hospital. [address], [address], [place], [place]  [address] ? [address] [address], [place], [place]   [address] [phone] ? [phone] ? FAX [phone]   [email or web address] site: [email or web address]   Post  Anesthesia Home Care Instructions  Activity: Get plenty of rest for the remainder of the day. A responsible individual must stay with you for 24 hours following the procedure.  For the next 24 hours, DO NOT: -Drive a car -Paediatric nurse -Drink alcoholic beverages -Take any medication unless instructed by your physician -Make any legal decisions or sign important papers.  Meals: Start with liquid foods such as gelatin or soup. Progress to regular foods as tolerated. Avoid greasy, spicy, heavy foods. If nausea and/or vomiting occur, drink only clear liquids until the nausea and/or vomiting subsides. Call your physician if vomiting continues.  Special Instructions/Symptoms: Your throat may feel dry or sore from the anesthesia or the breathing tube placed in your throat during surgery. If this causes discomfort, gargle with warm salt water. The discomfort should disappear within 24 hours.  If you had a scopolamine patch placed behind your ear for the management of post- operative nausea and/or vomiting:  1. The medication in the patch is effective for 72 hours, after which it should be removed.  Wrap patch in a tissue and discard in the trash. Wash hands thoroughly with soap and water. 2. You may remove the patch earlier than 72 hours if you experience unpleasant side effects which may include dry mouth, dizziness or visual disturbances. 3. Avoid touching the patch. Wash your hands with soap and water after contact with the patch.    Do not take Tylenol until 1:00pm.

## 2020-09-26 NOTE — Interval H&P Note (Signed)
History and Physical Interval Note:  09/26/2020 7:16 AM  Janet Gonzalez  has presented today for surgery, with the diagnosis of BILIARY COLIC.  The various methods of treatment have been discussed with the patient and family. After consideration of risks, benefits and other options for treatment, the patient has consented to  Procedure(s): LAPAROSCOPIC CHOLECYSTECTOMY (N/A) as a surgical intervention.  The patient's history has been reviewed, patient examined, no change in status, stable for surgery.  I have reviewed the patient's chart and labs.  Questions were answered to the patient's satisfaction.     Rolm Bookbinder

## 2020-09-26 NOTE — Anesthesia Preprocedure Evaluation (Signed)
Anesthesia Evaluation  Patient identified by MRN, date of birth, ID band Patient awake    Reviewed: Allergy & Precautions, NPO status , Patient's Chart, lab work & pertinent test results  History of Anesthesia Complications Negative for: history of anesthetic complications  Airway Mallampati: III  TM Distance: >3 FB Neck ROM: Full    Dental  (+) Teeth Intact   Pulmonary neg pulmonary ROS, former smoker,    breath sounds clear to auscultation       Cardiovascular negative cardio ROS   Rhythm:Regular     Neuro/Psych  Headaches, neg Seizures PSYCHIATRIC DISORDERS Anxiety negative neurological ROS  negative psych ROS   GI/Hepatic negative GI ROS, Neg liver ROS, GERD  Medicated and Controlled,  Endo/Other  negative endocrine ROSMorbid obesity  Renal/GU negative Renal ROS  negative genitourinary   Musculoskeletal negative musculoskeletal ROS (+)   Abdominal (+) + obese,   Peds negative pediatric ROS (+)  Hematology negative hematology ROS (+)   Anesthesia Other Findings   Reproductive/Obstetrics negative OB ROS                             Anesthesia Physical  Anesthesia Plan  ASA: III  Anesthesia Plan: General   Post-op Pain Management:    Induction: Intravenous  PONV Risk Score and Plan: 3 and Ondansetron, Dexamethasone, Midazolam and Treatment may vary due to age or medical condition  Airway Management Planned: Oral ETT  Additional Equipment:   Intra-op Plan:   Post-operative Plan: Extubation in OR  Informed Consent: I have reviewed the patients History and Physical, chart, labs and discussed the procedure including the risks, benefits and alternatives for the proposed anesthesia with the patient or authorized representative who has indicated his/her understanding and acceptance.     Dental advisory given  Plan Discussed with: Anesthesiologist  Anesthesia Plan Comments:          Anesthesia Quick Evaluation

## 2020-09-26 NOTE — Anesthesia Procedure Notes (Signed)
Procedure Name: Intubation Date/Time: 09/26/2020 7:48 AM Performed by: Lavonia Dana, CRNA Pre-anesthesia Checklist: Patient identified, Emergency Drugs available, Suction available and Patient being monitored Patient Re-evaluated:Patient Re-evaluated prior to induction Oxygen Delivery Method: Circle system utilized Preoxygenation: Pre-oxygenation with 100% oxygen Induction Type: IV induction Ventilation: Mask ventilation without difficulty Laryngoscope Size: Mac and 4 Grade View: Grade I Tube type: Oral Tube size: 7.0 mm Number of attempts: 1 Airway Equipment and Method: Stylet and Bite block Placement Confirmation: ETT inserted through vocal cords under direct vision,  positive ETCO2 and breath sounds checked- equal and bilateral Secured at: 21 cm Tube secured with: Tape Dental Injury: Teeth and Oropharynx as per pre-operative assessment

## 2020-09-26 NOTE — Progress Notes (Signed)
Assisted Dr. Sabra Heck with right, left, ultrasound guided, transabdominal plane block. Side rails up, monitors on throughout procedure. See vital signs in flow sheet. Tolerated Procedure well.

## 2020-09-26 NOTE — Anesthesia Procedure Notes (Signed)
Anesthesia Regional Block: TAP block   Pre-Anesthetic Checklist: ,, timeout performed, Correct Patient, Correct Site, Correct Laterality, Correct Procedure, Correct Position, site marked, Risks and benefits discussed,  Surgical consent,  Pre-op evaluation,  At surgeon's request and post-op pain management  Laterality: Left and Right  Prep: chloraprep       Needles:  Injection technique: Single-shot  Needle Type: Stimiplex     Needle Length: 9cm  Needle Gauge: 21     Additional Needles:   Procedures:,,,, ultrasound used (permanent image in chart),,,,  Narrative:  Start time: 09/26/2020 7:33 AM End time: 09/26/2020 7:38 AM Injection made incrementally with aspirations every 5 mL.  Performed by: Personally  Anesthesiologist: Lynda Rainwater, MD

## 2020-09-26 NOTE — H&P (Signed)
37 yof referred by Dr Alessandra Bevels for ruq pain. in computer she as ruq pain on imaging tests since 2006 that have mostly been normal. recent ruq pain has happened a couple times in past year or two. latest episodehas been for some time and required er visit. she had normal labs and gb was unremarkable on renal ct scan. she also received second dose covid vaccine and she states that has made nausea and other ab symptoms worse since then. she has no emesis (except right after vaccine). she has ruq pain, it is somewhat related to eating but she is still eating fine. she has globus sensation and this is being evaluated and made worse with recent trauma.  Past Surgical History  Oral Surgery  Thyroid Surgery   Diagnostic Studies History  Colonoscopy  never Mammogram  never Pap Smear  1-5 years ago  Allergies  Gabapentin *ANTICONVULSANTS*  Keflex *CEPHALOSPORINS*  Allergies Reconciled   Medication History Pantoprazole Sodium (40MG  Tablet DR, Oral) Active. Ondansetron HCl (4MG  Tablet, Oral) Active. Meclizine HCl (12.5MG  Tablet, Oral) Active. Medications Reconciled  Social History Alcohol use  Occasional alcohol use. Caffeine use  Coffee, Tea. No drug use  Tobacco use  Former smoker.  Family History Arthritis  Mother. Cerebrovascular Accident  Family Members In General. Colon Polyps  Mother. Depression  Mother. Diabetes Mellitus  Family Members In General. Family history unknown  First Degree Relatives  Heart Disease  Mother. Heart disease in female family member before age 8  Hypertension  Mother. Ischemic Bowel Disease  Mother. Thyroid problems  Mother.  Pregnancy / Birth History  Age at menarche  66 years. Contraceptive History  Oral contraceptives. Gravida  4 Irregular periods  Length (months) of breastfeeding  12-24 Maternal age  53-20 Para  2  Other Problems  Anxiety Disorder  Back Pain  Chest pain  Gastroesophageal  Reflux Disease  Migraine Headache  Thyroid Disease   Review of Systems  General Present- Fatigue, Weight Gain and Weight Loss. Not Present- Appetite Loss, Chills, Fever and Night Sweats. Skin Not Present- Change in Wart/Mole, Dryness, Hives, Jaundice, New Lesions, Non-Healing Wounds, Rash and Ulcer. HEENT Present- Ringing in the Ears. Not Present- Earache, Hearing Loss, Hoarseness, Nose Bleed, Oral Ulcers, Seasonal Allergies, Sinus Pain, Sore Throat, Visual Disturbances, Wears glasses/contact lenses and Yellow Eyes. Respiratory Present- Snoring. Not Present- Bloody sputum, Chronic Cough, Difficulty Breathing and Wheezing. Breast Not Present- Breast Mass, Breast Pain, Nipple Discharge and Skin Changes. Cardiovascular Present- Rapid Heart Rate. Not Present- Chest Pain, Difficulty Breathing Lying Down, Leg Cramps, Palpitations, Shortness of Breath and Swelling of Extremities. Gastrointestinal Present- Abdominal Pain, Bloating, Difficulty Swallowing, Indigestion and Nausea. Not Present- Bloody Stool, Change in Bowel Habits, Chronic diarrhea, Constipation, Excessive gas, Gets full quickly at meals, Hemorrhoids, Rectal Pain and Vomiting. Female Genitourinary Not Present- Frequency, Nocturia, Painful Urination, Pelvic Pain and Urgency. Musculoskeletal Present- Back Pain and Joint Pain. Not Present- Joint Stiffness, Muscle Pain, Muscle Weakness and Swelling of Extremities. Neurological Present- Decreased Memory and Headaches. Not Present- Fainting, Numbness, Seizures, Tingling, Tremor, Trouble walking and Weakness. Psychiatric Not Present- Anxiety, Bipolar, Change in Sleep Pattern, Depression, Fearful and Frequent crying. Endocrine Not Present- Cold Intolerance, Excessive Hunger, Hair Changes, Heat Intolerance, Hot flashes and New Diabetes. Hematology Not Present- Blood Thinners, Easy Bruising, Excessive bleeding, Gland problems, HIV and Persistent Infections.   Physical Exam  General Mental  Status-Alert. Orientation-Oriented X3. Eye Sclera/Conjunctiva - Bilateral-No scleral icterus. Abdomen Note: soft mildly tender ruq nondistended   Assessment & Plan  BILIARY DYSKINESIA (K82.8) Story: she has no stonesshe had renal stone protocol ct scan in er that doesnt show stones for what thats worth. she is concerned that surgery might not help situation. I do think it sounds like she has some biliary symptoms. we discussed hida scan is a dynamic test and doesnt mean her gb will never function again. I am going to get an [place] and if there are no stones/sludge then I think following her would be her preference. that would also allow her to have other issues figured out. if she has stones/sludge then she needs lap chole. we discussed success of lap chole for biliary dyskinesia and it is not 100% she had mild reproduction of her pain at best with ensure

## 2020-09-26 NOTE — Transfer of Care (Signed)
Immediate Anesthesia Transfer of Care Note  Patient: Janet Gonzalez  Procedure(s) Performed: LAPAROSCOPIC CHOLECYSTECTOMY (N/A Abdomen)  Patient Location: PACU  Anesthesia Type:GA combined with regional for post-op pain  Level of Consciousness: drowsy  Airway & Oxygen Therapy: Patient Spontanous Breathing and Patient connected to face mask oxygen  Post-op Assessment: Report given to RN and Post -op Vital signs reviewed and stable  Post vital signs: Reviewed and stable  Last Vitals:  Vitals Value Taken Time  BP 114/71 09/26/20 0850  Temp    Pulse 76 09/26/20 0851  Resp 14 09/26/20 0851  SpO2 100 % 09/26/20 0851  Vitals shown include unvalidated device data.  Last Pain:  Vitals:   09/26/20 0725  TempSrc: Oral  PainSc: 3          Complications: No complications documented.

## 2020-09-26 NOTE — Anesthesia Postprocedure Evaluation (Signed)
Anesthesia Post Note  Patient: Janet Gonzalez  Procedure(s) Performed: LAPAROSCOPIC CHOLECYSTECTOMY (N/A Abdomen)     Patient location during evaluation: PACU Anesthesia Type: General Level of consciousness: awake and alert Pain management: pain level controlled Vital Signs Assessment: post-procedure vital signs reviewed and stable Respiratory status: spontaneous breathing, nonlabored ventilation and respiratory function stable Cardiovascular status: blood pressure returned to baseline and stable Postop Assessment: no apparent nausea or vomiting Anesthetic complications: no   No complications documented.  Last Vitals:  Vitals:   09/26/20 0915 09/26/20 0928  BP: 120/86 132/88  Pulse: 72 73  Resp: 10 18  Temp:  36.6 C  SpO2: 100% 100%    Last Pain:  Vitals:   09/26/20 0928  TempSrc: Oral  PainSc: 3                  Lynda Rainwater

## 2020-09-27 ENCOUNTER — Encounter (HOSPITAL_BASED_OUTPATIENT_CLINIC_OR_DEPARTMENT_OTHER): Payer: Self-pay | Admitting: General Surgery

## 2020-09-27 LAB — SURGICAL PATHOLOGY

## 2020-09-27 NOTE — Addendum Note (Signed)
Addendum  created 09/27/20 1321 by Lavonia Dana, CRNA   Charge Capture section accepted

## 2022-01-31 ENCOUNTER — Other Ambulatory Visit: Payer: Self-pay | Admitting: Family Medicine

## 2022-01-31 DIAGNOSIS — Z1239 Encounter for other screening for malignant neoplasm of breast: Secondary | ICD-10-CM

## 2024-01-09 ENCOUNTER — Encounter: Payer: Self-pay | Admitting: Neurology

## 2024-01-14 ENCOUNTER — Other Ambulatory Visit: Payer: Self-pay

## 2024-01-14 DIAGNOSIS — R202 Paresthesia of skin: Secondary | ICD-10-CM

## 2024-02-10 ENCOUNTER — Ambulatory Visit (INDEPENDENT_AMBULATORY_CARE_PROVIDER_SITE_OTHER): Payer: 59 | Admitting: Neurology

## 2024-02-10 DIAGNOSIS — R202 Paresthesia of skin: Secondary | ICD-10-CM | POA: Diagnosis not present

## 2024-02-10 DIAGNOSIS — G5603 Carpal tunnel syndrome, bilateral upper limbs: Secondary | ICD-10-CM

## 2024-02-10 NOTE — Procedures (Signed)
  [facility]  [address] [place], [address]  [place], [place] [address] Tel: [phone] Fax: [phone] Test Date:  02/10/2024  Patient: Janet Gonzalez DOB: 11/11/1981 Physician: Jacquelyne Balint, MD  Sex: Female Height: 5\' 5"  Ref Phys: Margarita Rana, MD  ID#: [id number]   Technician:    History: This is a 43 year old female with right hand numbness and tingling.  NCV & EMG Findings: Extensive electrodiagnostic evaluation of the right upper limb with additional nerve conduction studies of the left upper limb shows: Bilateral median sensory responses show prolonged distal peak latency (L3.5, R3.6 ms). Right ulnar and radial sensory responses are within normal limits. Bilateral median (APB) and right ulnar (ADM) motor responses are within normal limits. There is no evidence of active or chronic motor axon loss changes affecting any of the tested muscles on needle examination. Motor unit configuration and recruitment pattern is within normal limits.  Impression: This is an abnormal study. The findings are most consistent with the following: Evidence of bilateral median mononeuropathy at or distal to the wrist, consistent with carpal tunnel syndrome, mild in degree electrically bilaterally. No electrodiagnostic evidence of a right cervical (C5-C8) motor radiculopathy. Screening studies for right ulnar or radial mononeuropathies are normal.    ___________________________ Jacquelyne Balint, MD    Nerve Conduction Studies Motor Nerve Results    Latency Amplitude F-Lat Segment Distance CV Comment  Site (ms) Norm (mV) Norm (ms)  (cm) (m/s) Norm   Left Median (APB) Motor  Wrist 3.2  < 3.9 6.7  > 6.0        Right Median (APB) Motor  Wrist 3.2  < 3.9 7.3  > 6.0        Elbow 8.2 - 6.9 -  Elbow-Wrist 27 54  > 50   Right Ulnar (ADM) Motor  Wrist 1.88  < 3.1 8.3  > 7.0        Bel elbow 5.6 - 8.2 -  Bel elbow-Wrist 22 59  > 50   Ab elbow 7.2 - 8.2 -  Ab elbow-Bel elbow 10 63 -     Sensory Sites    Neg Peak Lat Amplitude (O-P) Segment Distance Velocity Comment  Site (ms) Norm (V) Norm  (cm) (ms)   Left Median Sensory  Wrist-Dig II *3.5  < 3.4 41  > 20 Wrist-Dig II 13    Right Median Sensory  Wrist-Dig II *3.6  < 3.4 30  > 20 Wrist-Dig II 13    Right Radial Sensory  Forearm-Wrist 1.98  < 2.7 45  > 18 Forearm-Wrist 10    Right Ulnar Sensory  Wrist-Dig V 2.8  < 3.1 23  > 12 Wrist-Dig V 11     Electromyography   Side Muscle Ins.Act Fibs Fasc Recrt Amp Dur Poly Activation Comment  Right FDI Nml Nml Nml Nml Nml Nml Nml Nml N/A  Right EIP Nml Nml Nml Nml Nml Nml Nml Nml N/A  Right Pronator teres Nml Nml Nml Nml Nml Nml Nml Nml N/A  Right Biceps Nml Nml Nml Nml Nml Nml Nml Nml N/A  Right Triceps Nml Nml Nml Nml Nml Nml Nml Nml N/A  Right Deltoid Nml Nml Nml Nml Nml Nml Nml Nml N/A      Waveforms:  Motor        Sensory

## 2024-05-04 ENCOUNTER — Other Ambulatory Visit: Payer: Self-pay | Admitting: Physician Assistant

## 2024-05-04 DIAGNOSIS — Z1231 Encounter for screening mammogram for malignant neoplasm of breast: Secondary | ICD-10-CM

## 2024-08-24 ENCOUNTER — Emergency Department (HOSPITAL_BASED_OUTPATIENT_CLINIC_OR_DEPARTMENT_OTHER): Admitting: Radiology

## 2024-08-24 ENCOUNTER — Other Ambulatory Visit: Payer: Self-pay

## 2024-08-24 ENCOUNTER — Encounter (HOSPITAL_BASED_OUTPATIENT_CLINIC_OR_DEPARTMENT_OTHER): Payer: Self-pay | Admitting: Emergency Medicine

## 2024-08-24 ENCOUNTER — Emergency Department (HOSPITAL_BASED_OUTPATIENT_CLINIC_OR_DEPARTMENT_OTHER)
Admission: EM | Admit: 2024-08-24 | Discharge: 2024-08-24 | Disposition: A | Discharge status: Home / Self Care | Source: Ambulatory Visit | Attending: Emergency Medicine | Admitting: Emergency Medicine

## 2024-08-24 ENCOUNTER — Emergency Department (HOSPITAL_BASED_OUTPATIENT_CLINIC_OR_DEPARTMENT_OTHER)

## 2024-08-24 DIAGNOSIS — R42 Dizziness and giddiness: Secondary | ICD-10-CM | POA: Insufficient documentation

## 2024-08-24 DIAGNOSIS — E876 Hypokalemia: Secondary | ICD-10-CM | POA: Diagnosis not present

## 2024-08-24 DIAGNOSIS — R1084 Generalized abdominal pain: Secondary | ICD-10-CM | POA: Insufficient documentation

## 2024-08-24 DIAGNOSIS — R0789 Other chest pain: Secondary | ICD-10-CM | POA: Insufficient documentation

## 2024-08-24 DIAGNOSIS — R0602 Shortness of breath: Secondary | ICD-10-CM | POA: Insufficient documentation

## 2024-08-24 LAB — CBC WITH DIFFERENTIAL/PLATELET
Abs Immature Granulocytes: 0.03 K/uL (ref 0.00–0.07)
Basophils Absolute: 0 K/uL (ref 0.0–0.1)
Basophils Relative: 0 %
Eosinophils Absolute: 0.1 K/uL (ref 0.0–0.5)
Eosinophils Relative: 2 %
HCT: 35.7 % — ABNORMAL LOW (ref 36.0–46.0)
Hemoglobin: 12.3 g/dL (ref 12.0–15.0)
Immature Granulocytes: 1 %
Lymphocytes Relative: 33 %
Lymphs Abs: 1.6 K/uL (ref 0.7–4.0)
MCH: 29 pg (ref 26.0–34.0)
MCHC: 34.5 g/dL (ref 30.0–36.0)
MCV: 84.2 fL (ref 80.0–100.0)
Monocytes Absolute: 0.5 K/uL (ref 0.1–1.0)
Monocytes Relative: 11 %
Neutro Abs: 2.6 K/uL (ref 1.7–7.7)
Neutrophils Relative %: 53 %
Platelets: 292 K/uL (ref 150–400)
RBC: 4.24 MIL/uL (ref 3.87–5.11)
RDW: 14.4 % (ref 11.5–15.5)
WBC: 4.9 K/uL (ref 4.0–10.5)
nRBC: 0 % (ref 0.0–0.2)

## 2024-08-24 LAB — COMPREHENSIVE METABOLIC PANEL WITH GFR
ALT: 8 U/L (ref 0–44)
AST: 13 U/L — ABNORMAL LOW (ref 15–41)
Albumin: 4.1 g/dL (ref 3.5–5.0)
Alkaline Phosphatase: 100 U/L (ref 38–126)
Anion gap: 14 (ref 5–15)
BUN: 6 mg/dL (ref 6–20)
CO2: 20 mmol/L — ABNORMAL LOW (ref 22–32)
Calcium: 9.4 mg/dL (ref 8.9–10.3)
Chloride: 106 mmol/L (ref 98–111)
Creatinine, Ser: 0.65 mg/dL (ref 0.44–1.00)
GFR, Estimated: >60 mL/min (ref >60)
Glucose, Bld: 91 mg/dL (ref 70–99)
Potassium: 3.3 mmol/L — ABNORMAL LOW (ref 3.5–5.1)
Sodium: 140 mmol/L (ref 135–145)
Total Bilirubin: 0.4 mg/dL (ref 0.0–1.2)
Total Protein: 7.1 g/dL (ref 6.5–8.1)

## 2024-08-24 LAB — HCG, SERUM, QUALITATIVE: Preg, Serum: NEGATIVE

## 2024-08-24 LAB — D-DIMER, QUANTITATIVE: D-Dimer, Quant: 1.07 ug{FEU}/mL — ABNORMAL HIGH (ref 0.00–0.50)

## 2024-08-24 LAB — PRO BRAIN NATRIURETIC PEPTIDE: Pro Brain Natriuretic Peptide: 50 pg/mL (ref <300.0)

## 2024-08-24 LAB — TROPONIN T, HIGH SENSITIVITY: Troponin T High Sensitivity: <15 ng/L (ref 0–19)

## 2024-08-24 LAB — LIPASE, BLOOD: Lipase: 52 U/L — ABNORMAL HIGH (ref 11–51)

## 2024-08-24 MED ORDER — IOHEXOL 300 MG/ML  SOLN: 100.0000 mL | Freq: Once | INTRAMUSCULAR | Status: DC | PRN

## 2024-08-24 MED ORDER — POTASSIUM CHLORIDE CRYS ER 20 MEQ PO TBCR
40.0000 meq | EXTENDED_RELEASE_TABLET | Freq: Once | ORAL | Status: AC
  Administered 2024-08-24: 40 meq via ORAL
  Filled 2024-08-24: qty 2

## 2024-08-24 MED ORDER — IOHEXOL 350 MG/ML SOLN
100.0000 mL | Freq: Once | INTRAVENOUS | Status: AC | PRN
  Administered 2024-08-24: 100 mL via INTRAVENOUS

## 2024-08-24 MED ORDER — LACTATED RINGERS IV BOLUS
1000.0000 mL | Freq: Once | INTRAVENOUS | Status: AC
  Administered 2024-08-24: 1000 mL via INTRAVENOUS

## 2024-08-24 NOTE — ED Notes (Signed)
 DC paperwork given and verbally understood.

## 2024-08-24 NOTE — ED Provider Notes (Signed)
 [place] EMERGENCY DEPARTMENT AT [facility] Provider Note   CSN: [id number] Arrival date & time: 08/24/24  9144     Patient presents with: Shortness of Breath   Janet Gonzalez is a 43 y.o. female.   HPI 43 year old female presents with a chief complaint of shortness of breath and dizziness.  About 4 days ago developed vomiting and diarrhea for couple days.  The day after this started, 9/27, she started feeling like she was having some lightheadedness, shortness of breath and chest tightness.  Felt like it might have been dehydration.  The vomiting and diarrhea has been gone for over 24 hours but due to her symptoms she went to urgent care where they drew a metabolic panel and gave her 2 L of fluids.  She transiently felt better but this morning is having recurrent symptoms which include the palpitations, chest tightness, shortness of breath and lightheadedness.  She also has been having abdominal pain but does feel little better but is still present, primarily in her upper abdomen despite the vomiting and diarrhea being resolved.  She feels it is related to all the vomiting she had.  No urinary or vaginal symptoms.  No fevers.  No recent travel.  She had surgery over a month ago for carpal tunnel but no known history of DVT or recent leg swelling.  Prior to Admission medications   Medication Sig Start Date End Date Taking? Authorizing Provider  esomeprazole (NEXIUM) 40 MG capsule Take 40 mg by mouth daily. 08/17/24  Yes [provider]  FLUoxetine (PROZAC) 10 MG capsule Take 10 mg by mouth daily. 08/05/24  Yes [provider]  FLUoxetine (PROZAC) 20 MG capsule Take 20 mg by mouth daily. 07/26/24  Yes [provider]  meloxicam (MOBIC) 15 MG tablet Take 15 mg by mouth daily. 08/11/24  Yes [provider]  ZEPBOUND 12.5 MG/0.5ML Pen Inject 12.5 mg into the skin once a week. 08/01/24  Yes [provider]  Ascorbic Acid (VITAMIN C) 100 MG tablet  Take 100 mg by mouth daily.    [provider]  Beta Carotene (VITAMIN A) 25000 UNIT capsule Take 25,000 Units by mouth daily.    [provider]  calcium carbonate (TUMS - DOSED IN MG ELEMENTAL CALCIUM) 500 MG chewable tablet Chew 1 tablet by mouth daily.    [provider]  Cholecalciferol (VITAMIN D -3 PO) Take 1 capsule by mouth daily.    [provider]  CRANBERRY CONCENTRATE PO Take 1 tablet by mouth daily.    [provider]  ELDERBERRY PO Take by mouth.    [provider]  folic acid (FOLVITE) 400 MCG tablet Take 400 mcg by mouth daily.    [provider]  ondansetron  (ZOFRAN  ODT) 4 MG disintegrating tablet 4mg  ODT q4 hours prn nausea/vomit 03/23/20   Kehrli, Kelsey F, PA-C  oxyCODONE  (OXY IR/ROXICODONE ) 5 MG immediate release tablet Take 1 tablet (5 mg total) by mouth every 6 (six) hours as needed for moderate pain, severe pain or breakthrough pain. 09/26/20   Ebbie Cough, MD  Pantoprazole  Sodium (PROTONIX  PO) Take by mouth.    [provider]    Allergies: Cephalexin and Gabapentin     Review of Systems  Constitutional:  Negative for fever.  Respiratory:  Positive for chest tightness and shortness of breath.   Cardiovascular:  Negative for leg swelling.  Gastrointestinal:  Positive for abdominal pain.  Genitourinary:  Negative for dysuria.  Neurological:  Positive for light-headedness.  Updated Vital Signs BP 125/82   Pulse 76   Temp 98.2 F (36.8 C) (Oral)   Resp (!) 22   Wt 93.4 kg   LMP 07/28/2024   SpO2 100%   BMI 34.28 kg/m   Physical Exam Vitals and nursing note reviewed.  Constitutional:      General: She is not in acute distress.    Appearance: She is well-developed. She is not ill-appearing or diaphoretic.  HENT:     Head: Normocephalic and atraumatic.  Cardiovascular:     Rate and Rhythm: Normal rate and regular rhythm.     Heart sounds: Normal heart sounds.     Comments: HR  90s Pulmonary:     Effort: Pulmonary effort is normal.     Breath sounds: Normal breath sounds.  Abdominal:     Palpations: Abdomen is soft.     Tenderness: There is generalized abdominal tenderness.     Comments: Upper>lower abdominal tenderness  Musculoskeletal:     Right lower leg: No edema.     Left lower leg: No edema.  Skin:    General: Skin is warm and dry.  Neurological:     Mental Status: She is alert.     (all labs ordered are listed, but only abnormal results are displayed) Labs Reviewed  COMPREHENSIVE METABOLIC PANEL WITH GFR - Abnormal; Notable for the following components:      Result Value   Potassium 3.3 (*)    CO2 20 (*)    AST 13 (*)    All other components within normal limits  CBC WITH DIFFERENTIAL/PLATELET - Abnormal; Notable for the following components:   HCT 35.7 (*)    All other components within normal limits  LIPASE, BLOOD - Abnormal; Notable for the following components:   Lipase 52 (*)    All other components within normal limits  D-DIMER, QUANTITATIVE - Abnormal; Notable for the following components:   D-Dimer, Quant 1.07 (*)    All other components within normal limits  PRO BRAIN NATRIURETIC PEPTIDE  HCG, SERUM, QUALITATIVE  TROPONIN T, HIGH SENSITIVITY    EKG: EKG Interpretation Date/Time:  Tuesday August 24 2024 09:18:49 EDT Ventricular Rate:  88 PR Interval:  149 QRS Duration:  85 QT Interval:  352 QTC Calculation: 426 R Axis:   66  Text Interpretation: Sinus rhythm no acute ST/T changes overall similar to 2015 Confirmed by Freddi Hamilton [id number]) on 08/24/2024 9:26:50 AM  Radiology: CT ABDOMEN PELVIS W CONTRAST Result Date: 08/24/2024 CLINICAL DATA:  Abdominal pain with nausea, vomiting diarrhea 5 days. Shortness of breath and dizziness 2 days. Positive D-dimer. Low to intermediate probability for pulmonary embolism. EXAM: CT ANGIOGRAPHY CHEST CT ABDOMEN AND PELVIS WITH CONTRAST TECHNIQUE: Multidetector CT imaging of the chest  was performed using the standard protocol during bolus administration of intravenous contrast. Multiplanar CT image reconstructions and MIPs were obtained to evaluate the vascular anatomy. Multidetector CT imaging of the abdomen and pelvis was performed using the standard protocol during bolus administration of intravenous contrast. RADIATION DOSE REDUCTION: This exam was performed according to the departmental dose-optimization program which includes automated exposure control, adjustment of the mA and/or kV according to patient size and/or use of iterative reconstruction technique. CONTRAST:  OMNIPAQUE IOHEXOL 350 MG/ML SOLN COMPARISON:  Abdominopelvic noncontrast CT 03/23/2020 FINDINGS: CTA CHEST FINDINGS Cardiovascular: Heart is normal size. Thoracic aorta is normal in caliber and otherwise unremarkable. Pulmonary arterial system demonstrates no evidence of emboli. Remaining vascular structures are unremarkable. Mediastinum/Nodes: No mediastinal or  hilar adenopathy. Remaining mediastinal structures are unremarkable. Lungs/Pleura: Lungs are adequately inflated. No acute airspace process or effusion. Airways are normal. Musculoskeletal: No focal abnormality. Review of the MIP images confirms the above findings. CT ABDOMEN and PELVIS FINDINGS Hepatobiliary: Previous cholecystectomy. Liver and biliary tree are normal. Pancreas: Normal. Spleen: Normal. Adrenals/Urinary Tract: Adrenal glands are normal. Kidneys are normal in size without hydronephrosis or nephrolithiasis. Ureters and bladder are unremarkable. Stomach/Bowel: Stomach and small bowel are normal. Appendix is normal. Minimal redundancy of the sigmoid colon extending into the left mid to upper abdomen. Colon is otherwise unremarkable. Vascular/Lymphatic: Abdominal aorta is normal in caliber. Remaining vascular structures are unremarkable. There is no adenopathy. Reproductive: Uterus and bilateral adnexa are unremarkable. Other: No free fluid or focal  inflammatory change. 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 Review of the MIP images confirms the above findings. IMPRESSION: No acute findings in the chest, abdomen or pelvis. No evidence of pulmonary embolism. Electronically Signed   By: Toribio Agreste M.D.   On: 08/24/2024 11:08   CT Angio Chest PE W and/or Wo Contrast Result Date: 08/24/2024 CLINICAL DATA:  Abdominal pain with nausea, vomiting diarrhea 5 days. Shortness of breath and dizziness 2 days. Positive D-dimer. Low to intermediate probability for pulmonary embolism. EXAM: CT ANGIOGRAPHY CHEST CT ABDOMEN AND PELVIS WITH CONTRAST TECHNIQUE: Multidetector CT imaging of the chest was performed using the standard protocol during bolus administration of intravenous contrast. Multiplanar CT image reconstructions and MIPs were obtained to evaluate the vascular anatomy. Multidetector CT imaging of the abdomen and pelvis was performed using the standard protocol during bolus administration of intravenous contrast. RADIATION DOSE REDUCTION: This exam was performed according to the departmental dose-optimization program which includes automated exposure control, adjustment of the mA and/or kV according to patient size and/or use of iterative reconstruction technique. CONTRAST:  OMNIPAQUE IOHEXOL 350 MG/ML SOLN COMPARISON:  Abdominopelvic noncontrast CT 03/23/2020 FINDINGS: CTA CHEST FINDINGS Cardiovascular: Heart is normal size. Thoracic aorta is normal in caliber and otherwise unremarkable. Pulmonary arterial system demonstrates no evidence of emboli. Remaining vascular structures are unremarkable. Mediastinum/Nodes: No mediastinal or hilar adenopathy. Remaining mediastinal structures are unremarkable. Lungs/Pleura: Lungs are adequately inflated. No acute airspace process or effusion. Airways are normal. Musculoskeletal: No focal abnormality. Review of the MIP images confirms the above  findings. CT ABDOMEN and PELVIS FINDINGS Hepatobiliary: Previous cholecystectomy. Liver and biliary tree are normal. Pancreas: Normal. Spleen: Normal. Adrenals/Urinary Tract: Adrenal glands are normal. Kidneys are normal in size without hydronephrosis or nephrolithiasis. Ureters and bladder are unremarkable. Stomach/Bowel: Stomach and small bowel are normal. Appendix is normal. Minimal redundancy of the sigmoid colon extending into the left mid to upper abdomen. Colon is otherwise unremarkable. Vascular/Lymphatic: Abdominal aorta is normal in caliber. Remaining vascular structures are unremarkable. There is no adenopathy. Reproductive: Uterus and bilateral adnexa are unremarkable. Other: No free fluid or focal inflammatory change. 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 Review of the MIP images confirms the above findings. IMPRESSION: No acute findings in the chest, abdomen or pelvis. No evidence of pulmonary embolism. Electronically Signed   By: Toribio Agreste M.D.   On: 08/24/2024 11:08   DG Chest 2 View Result Date: 08/24/2024 CLINICAL DATA:  dyspnea EXAM: CHEST - 2 VIEW COMPARISON:  February 09, 2024 FINDINGS: The left costophrenic sulcus is excluded from the field of view. No focal airspace consolidation, pleural effusion, or pneumothorax. No cardiomegaly.No acute fracture  or destructive lesion. IMPRESSION: No acute cardiopulmonary abnormality. Electronically Signed   By: Rogelia Myers M.D.   On: 08/24/2024 10:14     Procedures   Medications Ordered in the ED  lactated ringers  bolus 1,000 mL (0 mLs Intravenous Stopped 08/24/24 1026)  iohexol (OMNIPAQUE) 350 MG/ML injection 100 mL (100 mLs Intravenous Contrast Given 08/24/24 1033)  potassium chloride SA (KLOR-CON M) CR tablet 40 mEq (40 mEq Oral Given 08/24/24 1129)                                    Medical Decision Making Amount and/or Complexity of Data Reviewed External  Data Reviewed: notes. Labs: ordered.    Details: Mild hypokalemia Radiology: ordered and independent interpretation performed.    Details: No PE, no bowel obstruction ECG/medicine tests: ordered and independent interpretation performed.    Details: No ischemia  Risk Prescription drug management.   Patient is feeling better with mostly fluids.  She was also given potassium replacement.  Workup was obtained and given her shortness of breath and symptoms being present despite the vomiting and diarrhea being resolved, I ordered a PE workup which included a positive D-dimer and a CTA.  The CTA is negative.  The CT of her abdomen given her abdominal tenderness is negative.  She is feeling much better after the fluids and feels like it was all dehydration related.  Will encourage increased fluids at home but her troponin is negative after several days of symptoms, other labs are unremarkable besides the mild hypokalemia, and the lipase is not clinically significantly elevated.  Doubt pancreatitis.  Appears stable for discharge home with return precautions, otherwise will have her follow-up with PCP.     Final diagnoses:  Lightheadedness  Hypokalemia    ED Discharge Orders     None          Freddi Hamilton, MD 08/24/24 1214

## 2024-08-24 NOTE — ED Triage Notes (Signed)
 Pt reports abd pain with n/v/d starting x 5 days pta. Resolved x 2 days pta. Endorses concern for dehydration. Reports shob and dizziness x 2 days. Tx at [facility] yesterday, with IV fluids

## 2024-08-24 NOTE — Discharge Instructions (Signed)
 Be sure to increase your fluid intake at home.  Call your primary care doctor for urgent outpatient follow-up.  Otherwise, your workup today has been reassuring.  Return to the ER for any new or worsening symptoms.
